# Patient Record
Sex: Female | Born: 1950 | Race: White | Hispanic: No | Smoking: Former smoker
Health system: Southern US, Community
[De-identification: ages and names within clinical notes are randomized; demographics above are authoritative.]

## PROBLEM LIST (undated history)

## (undated) DIAGNOSIS — Z9889 Other specified postprocedural states: Secondary | ICD-10-CM

## (undated) DIAGNOSIS — H2522 Age-related cataract, morgagnian type, left eye: Secondary | ICD-10-CM

## (undated) DIAGNOSIS — R011 Cardiac murmur, unspecified: Secondary | ICD-10-CM

## (undated) DIAGNOSIS — T4145XA Adverse effect of unspecified anesthetic, initial encounter: Secondary | ICD-10-CM

## (undated) DIAGNOSIS — T8859XA Other complications of anesthesia, initial encounter: Secondary | ICD-10-CM

## (undated) DIAGNOSIS — M1990 Unspecified osteoarthritis, unspecified site: Secondary | ICD-10-CM

## (undated) DIAGNOSIS — R112 Nausea with vomiting, unspecified: Secondary | ICD-10-CM

## (undated) HISTORY — PX: CHOLECYSTECTOMY: SHX55

## (undated) HISTORY — PX: APPENDECTOMY: SHX54

## (undated) HISTORY — PX: EYE SURGERY: SHX253

## (undated) HISTORY — PX: BACK SURGERY: SHX140

## (undated) HISTORY — PX: TONSILLECTOMY: SUR1361

## (undated) HISTORY — PX: JOINT REPLACEMENT: SHX530

## (undated) HISTORY — PX: OTHER SURGICAL HISTORY: SHX169

---

## 2018-04-16 ENCOUNTER — Other Ambulatory Visit: Payer: Self-pay | Admitting: Orthopedic Surgery

## 2018-04-16 ENCOUNTER — Ambulatory Visit
Admission: RE | Admit: 2018-04-16 | Discharge: 2018-04-16 | Disposition: A | Discharge status: Home / Self Care | Payer: Medicare Other | Source: Ambulatory Visit | Attending: Orthopedic Surgery | Admitting: Orthopedic Surgery

## 2018-04-16 DIAGNOSIS — M25561 Pain in right knee: Secondary | ICD-10-CM

## 2018-04-16 DIAGNOSIS — M25551 Pain in right hip: Secondary | ICD-10-CM

## 2018-08-17 ENCOUNTER — Other Ambulatory Visit: Payer: Self-pay | Admitting: Orthopedic Surgery

## 2018-09-24 ENCOUNTER — Other Ambulatory Visit (HOSPITAL_COMMUNITY): Payer: Self-pay | Admitting: *Deleted

## 2018-09-24 NOTE — Patient Instructions (Signed)
Tamara Alvarado  09/24/2018   Your procedure is scheduled on:  10-05-2018  Report to [facility] Main  Entrance  Report to admitting at 655 AM    Call this number if you have problems the morning of surgery [phone]   Remember: Do not eat food or drink liquids :After Midnight. BRUSH YOUR TEETH MORNING OF SURGERY AND RINSE YOUR MOUTH OUT, NO CHEWING GUM CANDY OR MINTS.     Take these medicines the morning of surgery with A SIP OF WATER: none                               You may not have any metal on your body including hair pins and              piercings  Do not wear jewelry, make-up, lotions, powders or perfumes, deodorant             Do not wear nail polish.  Do not shave  48 hours prior to surgery.              Men may shave face and neck.   Do not bring valuables to the hospital. [facility] IS NOT             RESPONSIBLE   FOR VALUABLES.  Contacts, dentures or bridgework may not be worn into surgery.  Leave suitcase in the car. After surgery it may be brought to your room.                  Please read over the following fact sheets you were given: _____________________________________________________________________             [facility] - Preparing for Surgery Before surgery, you can play an important role.  Because skin is not sterile, your skin needs to be as free of germs as possible.  You can reduce the number of germs on your skin by washing with CHG (chlorahexidine gluconate) soap before surgery.  CHG is an antiseptic cleaner which kills germs and bonds with the skin to continue killing germs even after washing. Please DO NOT use if you have an allergy to CHG or antibacterial soaps.  If your skin becomes reddened/irritated stop using the CHG and inform your nurse when you arrive at Short Stay. Do not shave (including legs and underarms) for at least 48 hours prior to the first CHG shower.  You may shave your face/neck. Please follow these  instructions carefully:  1.  Shower with CHG Soap the night before surgery and the  morning of Surgery.  2.  If you choose to wash your hair, wash your hair first as usual with your  normal  shampoo.  3.  After you shampoo, rinse your hair and body thoroughly to remove the  shampoo.                           4.  Use CHG as you would any other liquid soap.  You can apply chg directly  to the skin and wash                       Gently with a scrungie or clean washcloth.  5.  Apply the CHG Soap to your body ONLY FROM THE NECK DOWN.  Do not use on face/ open                           Wound or open sores. Avoid contact with eyes, ears mouth and genitals (private parts).                       Wash face,  Genitals (private parts) with your normal soap.             6.  Wash thoroughly, paying special attention to the area where your surgery  will be performed.  7.  Thoroughly rinse your body with warm water from the neck down.  8.  DO NOT shower/wash with your normal soap after using and rinsing off  the CHG Soap.                9.  Pat yourself dry with a clean towel.            10.  Wear clean pajamas.            11.  Place clean sheets on your bed the night of your first shower and do not  sleep with pets. Day of Surgery : Do not apply any lotions/deodorants the morning of surgery.  Please wear clean clothes to the hospital/surgery center.  FAILURE TO FOLLOW THESE INSTRUCTIONS MAY RESULT IN THE CANCELLATION OF YOUR SURGERY PATIENT SIGNATURE_________________________________  NURSE SIGNATURE__________________________________  ________________________________________________________________________   Adam Phenix  An incentive spirometer is a tool that can help keep your lungs clear and active. This tool measures how well you are filling your lungs with each breath. Taking long deep breaths may help reverse or decrease the chance of developing breathing (pulmonary) problems (especially  infection) following:  A long period of time when you are unable to move or be active. BEFORE THE PROCEDURE   If the spirometer includes an indicator to show your best effort, your nurse or respiratory therapist will set it to a desired goal.  If possible, sit up straight or lean slightly forward. Try not to slouch.  Hold the incentive spirometer in an upright position. INSTRUCTIONS FOR USE  1. Sit on the edge of your bed if possible, or sit up as far as you can in bed or on a chair. 2. Hold the incentive spirometer in an upright position. 3. Breathe out normally. 4. Place the mouthpiece in your mouth and seal your lips tightly around it. 5. Breathe in slowly and as deeply as possible, raising the piston or the ball toward the top of the column. 6. Hold your breath for 3-5 seconds or for as long as possible. Allow the piston or ball to fall to the bottom of the column. 7. Remove the mouthpiece from your mouth and breathe out normally. 8. Rest for a few seconds and repeat Steps 1 through 7 at least 10 times every 1-2 hours when you are awake. Take your time and take a few normal breaths between deep breaths. 9. The spirometer may include an indicator to show your best effort. Use the indicator as a goal to work toward during each repetition. 10. After each set of 10 deep breaths, practice coughing to be sure your lungs are clear. If you have an incision (the cut made at the time of surgery), support your incision when coughing by placing a pillow or rolled up towels firmly against it. Once you are able to get out of  bed, walk around indoors and cough well. You may stop using the incentive spirometer when instructed by your caregiver.  RISKS AND COMPLICATIONS  Take your time so you do not get dizzy or light-headed.  If you are in pain, you may need to take or ask for pain medication before doing incentive spirometry. It is harder to take a deep breath if you are having pain. AFTER  USE  Rest and breathe slowly and easily.  It can be helpful to keep track of a log of your progress. Your caregiver can provide you with a simple table to help with this. If you are using the spirometer at home, follow these instructions: [place] IF:   You are having difficultly using the spirometer.  You have trouble using the spirometer as often as instructed.  Your pain medication is not giving enough relief while using the spirometer.  You develop fever of 100.5 F (38.1 C) or higher. SEEK IMMEDIATE MEDICAL CARE IF:   You cough up bloody sputum that had not been present before.  You develop fever of 102 F (38.9 C) or greater.  You develop worsening pain at or near the incision site. MAKE SURE YOU:   Understand these instructions.  Will watch your condition.  Will get help right away if you are not doing well or get worse. Document Released: 01/13/2007 Document Revised: 11/25/2011 Document Reviewed: 03/16/2007 Exit[employer] Patient Information 2014 Exit[employer], [place].   ________________________________________________________________________  WHAT IS A BLOOD TRANSFUSION? Blood Transfusion Information  A transfusion is the replacement of blood or some of its parts. Blood is made up of multiple cells which provide different functions.  Red blood cells carry oxygen and are used for blood loss replacement.  White blood cells fight against infection.  Platelets control bleeding.  Plasma helps clot blood.  Other blood products are available for specialized needs, such as hemophilia or other clotting disorders. BEFORE THE TRANSFUSION  Who gives blood for transfusions?   Healthy volunteers who are fully evaluated to make sure their blood is safe. This is blood bank blood. Transfusion therapy is the safest it has ever been in the practice of medicine. Before blood is taken from a donor, a complete history is taken to make sure that person has no history of diseases  nor engages in risky social behavior (examples are intravenous drug use or sexual activity with multiple partners). The donor's travel history is screened to minimize risk of transmitting infections, such as malaria. The donated blood is tested for signs of infectious diseases, such as HIV and hepatitis. The blood is then tested to be sure it is compatible with you in order to minimize the chance of a transfusion reaction. If you or a relative donates blood, this is often done in anticipation of surgery and is not appropriate for emergency situations. It takes many days to process the donated blood. RISKS AND COMPLICATIONS Although transfusion therapy is very safe and saves many lives, the main dangers of transfusion include:   Getting an infectious disease.  Developing a transfusion reaction. This is an allergic reaction to something in the blood you were given. Every precaution is taken to prevent this. The decision to have a blood transfusion has been considered carefully by your caregiver before blood is given. Blood is not given unless the benefits outweigh the risks. AFTER THE TRANSFUSION  Right after receiving a blood transfusion, you will usually feel much better and more energetic. This is especially true if your red blood  cells have gotten low (anemic). The transfusion raises the level of the red blood cells which carry oxygen, and this usually causes an energy increase.  The nurse administering the transfusion will monitor you carefully for complications. HOME CARE INSTRUCTIONS  No special instructions are needed after a transfusion. You may find your energy is better. Speak with your caregiver about any limitations on activity for underlying diseases you may have. SEEK MEDICAL CARE IF:   Your condition is not improving after your transfusion.  You develop redness or irritation at the intravenous (IV) site. SEEK IMMEDIATE MEDICAL CARE IF:  Any of the following symptoms occur over the  next 12 hours:  Shaking chills.  You have a temperature by mouth above 102 F (38.9 C), not controlled by medicine.  Chest, back, or muscle pain.  People around you feel you are not acting correctly or are confused.  Shortness of breath or difficulty breathing.  Dizziness and fainting.  You get a rash or develop hives.  You have a decrease in urine output.  Your urine turns a dark color or changes to pink, red, or Jacober. Any of the following symptoms occur over the next 10 days:  You have a temperature by mouth above 102 F (38.9 C), not controlled by medicine.  Shortness of breath.  Weakness after normal activity.  The white part of the eye turns yellow (jaundice).  You have a decrease in the amount of urine or are urinating less often.  Your urine turns a dark color or changes to pink, red, or Stober. Document Released: 08/30/2000 Document Revised: 11/25/2011 Document Reviewed: 04/18/2008 [facility] Patient Information 2014 [place], [place].  _______________________________________________________________________

## 2018-09-30 ENCOUNTER — Other Ambulatory Visit: Payer: Self-pay

## 2018-09-30 ENCOUNTER — Encounter (HOSPITAL_COMMUNITY)
Admission: RE | Admit: 2018-09-30 | Discharge: 2018-09-30 | Disposition: A | Discharge status: Home / Self Care | Payer: Medicare Other | Source: Ambulatory Visit | Attending: Orthopedic Surgery | Admitting: Orthopedic Surgery

## 2018-09-30 ENCOUNTER — Encounter (HOSPITAL_COMMUNITY): Payer: Self-pay | Admitting: *Deleted

## 2018-09-30 DIAGNOSIS — M1711 Unilateral primary osteoarthritis, right knee: Secondary | ICD-10-CM | POA: Diagnosis not present

## 2018-09-30 DIAGNOSIS — Z01812 Encounter for preprocedural laboratory examination: Secondary | ICD-10-CM | POA: Insufficient documentation

## 2018-09-30 HISTORY — DX: Adverse effect of unspecified anesthetic, initial encounter: T41.45XA

## 2018-09-30 HISTORY — DX: Cardiac murmur, unspecified: R01.1

## 2018-09-30 HISTORY — DX: Unspecified osteoarthritis, unspecified site: M19.90

## 2018-09-30 HISTORY — DX: Other complications of anesthesia, initial encounter: T88.59XA

## 2018-09-30 HISTORY — DX: Other specified postprocedural states: Z98.890

## 2018-09-30 HISTORY — DX: Age-related cataract, morgagnian type, left eye: H25.22

## 2018-09-30 HISTORY — DX: Other specified postprocedural states: R11.2

## 2018-09-30 LAB — COMPREHENSIVE METABOLIC PANEL
ALT: 13 U/L (ref 0–44)
AST: 23 U/L (ref 15–41)
Albumin: 4.2 g/dL (ref 3.5–5.0)
Alkaline Phosphatase: 53 U/L (ref 38–126)
Anion gap: 9 (ref 5–15)
BUN: 21 mg/dL (ref 8–23)
CO2: 25 mmol/L (ref 22–32)
CREATININE: 0.88 mg/dL (ref 0.44–1.00)
Calcium: 9.4 mg/dL (ref 8.9–10.3)
Chloride: 105 mmol/L (ref 98–111)
GFR calc non Af Amer: >60 mL/min (ref >60)
Glucose, Bld: 99 mg/dL (ref 70–99)
Potassium: 4.5 mmol/L (ref 3.5–5.1)
Sodium: 139 mmol/L (ref 135–145)
Total Bilirubin: 0.6 mg/dL (ref 0.3–1.2)
Total Protein: 7.5 g/dL (ref 6.5–8.1)

## 2018-09-30 LAB — CBC WITH DIFFERENTIAL/PLATELET
Abs Immature Granulocytes: 0.02 10*3/uL (ref 0.00–0.07)
Basophils Absolute: 0.1 10*3/uL (ref 0.0–0.1)
Basophils Relative: 2 %
Eosinophils Absolute: 0.1 10*3/uL (ref 0.0–0.5)
Eosinophils Relative: 1 %
HCT: 42.5 % (ref 36.0–46.0)
Hemoglobin: 13.3 g/dL (ref 12.0–15.0)
Immature Granulocytes: 0 %
Lymphocytes Relative: 37 %
Lymphs Abs: 1.9 10*3/uL (ref 0.7–4.0)
MCH: 30.9 pg (ref 26.0–34.0)
MCHC: 31.3 g/dL (ref 30.0–36.0)
MCV: 98.6 fL (ref 80.0–100.0)
MONO ABS: 0.4 10*3/uL (ref 0.1–1.0)
Monocytes Relative: 8 %
NEUTROS ABS: 2.7 10*3/uL (ref 1.7–7.7)
Neutrophils Relative %: 52 %
Platelets: 273 10*3/uL (ref 150–400)
RBC: 4.31 MIL/uL (ref 3.87–5.11)
RDW: 13.1 % (ref 11.5–15.5)
WBC: 5.1 10*3/uL (ref 4.0–10.5)
nRBC: 0 % (ref 0.0–0.2)

## 2018-09-30 LAB — SURGICAL PCR SCREEN
MRSA, PCR: NEGATIVE
STAPHYLOCOCCUS AUREUS: POSITIVE — AB

## 2018-10-04 MED ORDER — BUPIVACAINE LIPOSOME 1.3 % IJ SUSP
20.0000 mL | Freq: Once | INTRAMUSCULAR | Status: DC
  Filled 2018-10-04: qty 20

## 2018-10-05 ENCOUNTER — Encounter (HOSPITAL_COMMUNITY)
Admission: RE | Disposition: A | Discharge status: Home / Self Care | Payer: Self-pay | Source: Home / Self Care | Attending: Orthopedic Surgery

## 2018-10-05 ENCOUNTER — Encounter (HOSPITAL_COMMUNITY): Payer: Self-pay

## 2018-10-05 ENCOUNTER — Observation Stay (HOSPITAL_COMMUNITY)
Admission: RE | Admit: 2018-10-05 | Discharge: 2018-10-06 | Disposition: A | Discharge status: Home / Self Care | Payer: Medicare Other | Attending: Orthopedic Surgery | Admitting: Orthopedic Surgery

## 2018-10-05 ENCOUNTER — Ambulatory Visit (HOSPITAL_COMMUNITY): Payer: Medicare Other | Admitting: Physician Assistant

## 2018-10-05 ENCOUNTER — Other Ambulatory Visit: Payer: Self-pay

## 2018-10-05 ENCOUNTER — Ambulatory Visit (HOSPITAL_COMMUNITY): Payer: Medicare Other | Admitting: Anesthesiology

## 2018-10-05 DIAGNOSIS — Z881 Allergy status to other antibiotic agents status: Secondary | ICD-10-CM | POA: Diagnosis not present

## 2018-10-05 DIAGNOSIS — Z87891 Personal history of nicotine dependence: Secondary | ICD-10-CM | POA: Diagnosis not present

## 2018-10-05 DIAGNOSIS — Z91040 Latex allergy status: Secondary | ICD-10-CM | POA: Diagnosis not present

## 2018-10-05 DIAGNOSIS — Z882 Allergy status to sulfonamides status: Secondary | ICD-10-CM | POA: Insufficient documentation

## 2018-10-05 DIAGNOSIS — M1711 Unilateral primary osteoarthritis, right knee: Principal | ICD-10-CM | POA: Insufficient documentation

## 2018-10-05 DIAGNOSIS — Z96659 Presence of unspecified artificial knee joint: Secondary | ICD-10-CM

## 2018-10-05 DIAGNOSIS — Z880 Allergy status to penicillin: Secondary | ICD-10-CM | POA: Diagnosis not present

## 2018-10-05 DIAGNOSIS — Z96643 Presence of artificial hip joint, bilateral: Secondary | ICD-10-CM | POA: Diagnosis not present

## 2018-10-05 HISTORY — PX: TOTAL KNEE ARTHROPLASTY: SHX125

## 2018-10-05 SURGERY — ARTHROPLASTY, KNEE, TOTAL
Anesthesia: Regional | Site: Knee | Laterality: Right

## 2018-10-05 MED ORDER — GABAPENTIN 300 MG PO CAPS
300.0000 mg | ORAL_CAPSULE | Freq: Three times a day (TID) | ORAL | Status: DC
  Administered 2018-10-05 – 2018-10-06 (×3): 300 mg via ORAL
  Filled 2018-10-05 (×3): qty 1

## 2018-10-05 MED ORDER — CHLORHEXIDINE GLUCONATE 4 % EX LIQD: 60.0000 mL | Freq: Once | CUTANEOUS | Status: DC

## 2018-10-05 MED ORDER — METHOCARBAMOL 500 MG PO TABS
500.0000 mg | ORAL_TABLET | Freq: Four times a day (QID) | ORAL | Status: DC | PRN
  Administered 2018-10-06: 500 mg via ORAL
  Filled 2018-10-05: qty 1

## 2018-10-05 MED ORDER — SENNOSIDES-DOCUSATE SODIUM 8.6-50 MG PO TABS: 1.0000 | ORAL_TABLET | Freq: Every evening | ORAL | Status: DC | PRN

## 2018-10-05 MED ORDER — DEXAMETHASONE SODIUM PHOSPHATE 10 MG/ML IJ SOLN: 8.0000 mg | Freq: Once | INTRAMUSCULAR | Status: DC

## 2018-10-05 MED ORDER — ACETAMINOPHEN 500 MG PO TABS
1000.0000 mg | ORAL_TABLET | Freq: Four times a day (QID) | ORAL | Status: AC
  Administered 2018-10-05 (×3): 1000 mg via ORAL
  Filled 2018-10-05 (×4): qty 2

## 2018-10-05 MED ORDER — METOCLOPRAMIDE HCL 5 MG/ML IJ SOLN: 5.0000 mg | Freq: Three times a day (TID) | INTRAMUSCULAR | Status: DC | PRN

## 2018-10-05 MED ORDER — DIPHENHYDRAMINE HCL 12.5 MG/5ML PO ELIX: 12.5000 mg | ORAL_SOLUTION | ORAL | Status: DC | PRN

## 2018-10-05 MED ORDER — CEFAZOLIN SODIUM-DEXTROSE 2-4 GM/100ML-% IV SOLN
2.0000 g | Freq: Four times a day (QID) | INTRAVENOUS | Status: AC
  Administered 2018-10-05 (×2): 2 g via INTRAVENOUS
  Filled 2018-10-05 (×2): qty 100

## 2018-10-05 MED ORDER — BUPIVACAINE-EPINEPHRINE 0.5% -1:200000 IJ SOLN
INTRAMUSCULAR | Status: DC | PRN
  Administered 2018-10-05: 30 mL

## 2018-10-05 MED ORDER — SODIUM CHLORIDE 0.9 % IR SOLN
Status: DC | PRN
  Administered 2018-10-05 (×2): 1000 mL

## 2018-10-05 MED ORDER — DEXAMETHASONE SODIUM PHOSPHATE 10 MG/ML IJ SOLN
10.0000 mg | Freq: Once | INTRAMUSCULAR | Status: AC
  Administered 2018-10-06: 10 mg via INTRAVENOUS
  Filled 2018-10-05: qty 1

## 2018-10-05 MED ORDER — BUPIVACAINE-EPINEPHRINE (PF) 0.25% -1:200000 IJ SOLN
INTRAMUSCULAR | Status: AC
  Filled 2018-10-05: qty 30

## 2018-10-05 MED ORDER — BISACODYL 5 MG PO TBEC: 5.0000 mg | DELAYED_RELEASE_TABLET | Freq: Every day | ORAL | Status: DC | PRN

## 2018-10-05 MED ORDER — SODIUM CHLORIDE 0.9 % IV SOLN
INTRAVENOUS | Status: DC | PRN
  Administered 2018-10-05: 25 ug/min via INTRAVENOUS

## 2018-10-05 MED ORDER — BUPIVACAINE LIPOSOME 1.3 % IJ SUSP
INTRAMUSCULAR | Status: DC | PRN
  Administered 2018-10-05: 20 mL

## 2018-10-05 MED ORDER — PROPOFOL 500 MG/50ML IV EMUL
INTRAVENOUS | Status: DC | PRN
  Administered 2018-10-05: 75 ug/kg/min via INTRAVENOUS

## 2018-10-05 MED ORDER — OXYCODONE HCL 5 MG PO TABS
5.0000 mg | ORAL_TABLET | ORAL | Status: DC | PRN
  Filled 2018-10-05: qty 2

## 2018-10-05 MED ORDER — SODIUM CHLORIDE 0.9 % IV SOLN
INTRAVENOUS | Status: DC
  Administered 2018-10-05: 15:00:00 via INTRAVENOUS

## 2018-10-05 MED ORDER — HYDROMORPHONE HCL 1 MG/ML IJ SOLN: 0.5000 mg | INTRAMUSCULAR | Status: DC | PRN

## 2018-10-05 MED ORDER — DEXAMETHASONE SODIUM PHOSPHATE 10 MG/ML IJ SOLN
INTRAMUSCULAR | Status: DC | PRN
  Administered 2018-10-05: 8 mg via INTRAVENOUS

## 2018-10-05 MED ORDER — FENTANYL CITRATE (PF) 250 MCG/5ML IJ SOLN
INTRAMUSCULAR | Status: DC | PRN
  Administered 2018-10-05 (×2): 25 ug via INTRAVENOUS

## 2018-10-05 MED ORDER — LACTATED RINGERS IV SOLN
INTRAVENOUS | Status: DC
  Administered 2018-10-05: 1000 mL via INTRAVENOUS
  Administered 2018-10-05: 10:00:00 via INTRAVENOUS

## 2018-10-05 MED ORDER — PHENOL 1.4 % MT LIQD: 1.0000 | OROMUCOSAL | Status: DC | PRN

## 2018-10-05 MED ORDER — FENTANYL CITRATE (PF) 100 MCG/2ML IJ SOLN
50.0000 ug | INTRAMUSCULAR | Status: DC
  Filled 2018-10-05: qty 2

## 2018-10-05 MED ORDER — METOCLOPRAMIDE HCL 5 MG PO TABS: 5.0000 mg | ORAL_TABLET | Freq: Three times a day (TID) | ORAL | Status: DC | PRN

## 2018-10-05 MED ORDER — FENTANYL CITRATE (PF) 100 MCG/2ML IJ SOLN: 25.0000 ug | INTRAMUSCULAR | Status: DC | PRN

## 2018-10-05 MED ORDER — MENTHOL 3 MG MT LOZG: 1.0000 | LOZENGE | OROMUCOSAL | Status: DC | PRN

## 2018-10-05 MED ORDER — BUPIVACAINE IN DEXTROSE 0.75-8.25 % IT SOLN
INTRATHECAL | Status: DC | PRN
  Administered 2018-10-05: 1.6 mL via INTRATHECAL

## 2018-10-05 MED ORDER — CLONIDINE HCL (ANALGESIA) 100 MCG/ML EP SOLN
EPIDURAL | Status: DC | PRN
  Administered 2018-10-05: 50 ug

## 2018-10-05 MED ORDER — TRANEXAMIC ACID-NACL 1000-0.7 MG/100ML-% IV SOLN
1000.0000 mg | INTRAVENOUS | Status: AC
  Administered 2018-10-05: 1000 mg via INTRAVENOUS
  Filled 2018-10-05: qty 100

## 2018-10-05 MED ORDER — DOCUSATE SODIUM 100 MG PO CAPS
100.0000 mg | ORAL_CAPSULE | Freq: Two times a day (BID) | ORAL | Status: DC
  Administered 2018-10-05 – 2018-10-06 (×3): 100 mg via ORAL
  Filled 2018-10-05 (×3): qty 1

## 2018-10-05 MED ORDER — MIDAZOLAM HCL 2 MG/2ML IJ SOLN
INTRAMUSCULAR | Status: DC | PRN
  Administered 2018-10-05: 1 mg via INTRAVENOUS

## 2018-10-05 MED ORDER — TRANEXAMIC ACID-NACL 1000-0.7 MG/100ML-% IV SOLN
1000.0000 mg | Freq: Once | INTRAVENOUS | Status: AC
  Administered 2018-10-05: 1000 mg via INTRAVENOUS
  Filled 2018-10-05: qty 100

## 2018-10-05 MED ORDER — MIDAZOLAM HCL 2 MG/2ML IJ SOLN
INTRAMUSCULAR | Status: AC
  Filled 2018-10-05: qty 2

## 2018-10-05 MED ORDER — ACETAMINOPHEN 500 MG PO TABS
1000.0000 mg | ORAL_TABLET | Freq: Once | ORAL | Status: AC
  Administered 2018-10-05: 1000 mg via ORAL
  Filled 2018-10-05: qty 2

## 2018-10-05 MED ORDER — SODIUM CHLORIDE 0.9% FLUSH
INTRAVENOUS | Status: DC | PRN
  Administered 2018-10-05: 20 mL

## 2018-10-05 MED ORDER — PROPOFOL 10 MG/ML IV BOLUS
INTRAVENOUS | Status: DC | PRN
  Administered 2018-10-05: 20 mg via INTRAVENOUS

## 2018-10-05 MED ORDER — METHOCARBAMOL 500 MG IVPB - SIMPLE MED
INTRAVENOUS | Status: AC
  Filled 2018-10-05: qty 50

## 2018-10-05 MED ORDER — LIDOCAINE 2% (20 MG/ML) 5 ML SYRINGE
INTRAMUSCULAR | Status: DC | PRN
  Administered 2018-10-05: 40 mg via INTRAVENOUS

## 2018-10-05 MED ORDER — ONDANSETRON HCL 4 MG PO TABS
4.0000 mg | ORAL_TABLET | Freq: Four times a day (QID) | ORAL | Status: DC | PRN
  Administered 2018-10-05: 4 mg via ORAL
  Filled 2018-10-05: qty 1

## 2018-10-05 MED ORDER — MIDAZOLAM HCL 2 MG/2ML IJ SOLN
0.5000 mg | INTRAMUSCULAR | Status: DC
  Administered 2018-10-05: 2 mg via INTRAVENOUS
  Filled 2018-10-05: qty 2

## 2018-10-05 MED ORDER — TRAMADOL HCL 50 MG PO TABS
50.0000 mg | ORAL_TABLET | Freq: Four times a day (QID) | ORAL | Status: DC
  Administered 2018-10-05 – 2018-10-06 (×4): 50 mg via ORAL
  Filled 2018-10-05 (×5): qty 1

## 2018-10-05 MED ORDER — FENTANYL CITRATE (PF) 250 MCG/5ML IJ SOLN
INTRAMUSCULAR | Status: AC
  Filled 2018-10-05: qty 5

## 2018-10-05 MED ORDER — ZOLPIDEM TARTRATE 5 MG PO TABS: 5.0000 mg | ORAL_TABLET | Freq: Every evening | ORAL | Status: DC | PRN

## 2018-10-05 MED ORDER — FERROUS SULFATE 325 (65 FE) MG PO TABS
325.0000 mg | ORAL_TABLET | Freq: Three times a day (TID) | ORAL | Status: DC
  Administered 2018-10-05 – 2018-10-06 (×3): 325 mg via ORAL
  Filled 2018-10-05 (×3): qty 1

## 2018-10-05 MED ORDER — SODIUM CHLORIDE (PF) 0.9 % IJ SOLN
INTRAMUSCULAR | Status: AC
  Filled 2018-10-05: qty 20

## 2018-10-05 MED ORDER — CEFAZOLIN SODIUM-DEXTROSE 2-4 GM/100ML-% IV SOLN
2.0000 g | INTRAVENOUS | Status: AC
  Administered 2018-10-05: 2 g via INTRAVENOUS
  Filled 2018-10-05: qty 100

## 2018-10-05 MED ORDER — PROPOFOL 10 MG/ML IV BOLUS
INTRAVENOUS | Status: AC
  Filled 2018-10-05: qty 60

## 2018-10-05 MED ORDER — METHOCARBAMOL 500 MG IVPB - SIMPLE MED
500.0000 mg | Freq: Four times a day (QID) | INTRAVENOUS | Status: DC | PRN
  Administered 2018-10-05: 500 mg via INTRAVENOUS
  Filled 2018-10-05: qty 50

## 2018-10-05 MED ORDER — ONDANSETRON HCL 4 MG/2ML IJ SOLN: 4.0000 mg | Freq: Four times a day (QID) | INTRAMUSCULAR | Status: DC | PRN

## 2018-10-05 MED ORDER — ALUM & MAG HYDROXIDE-SIMETH 200-200-20 MG/5ML PO SUSP: 30.0000 mL | ORAL | Status: DC | PRN

## 2018-10-05 MED ORDER — BUPIVACAINE HCL (PF) 0.5 % IJ SOLN
INTRAMUSCULAR | Status: DC | PRN
  Administered 2018-10-05: 20 mL via PERINEURAL

## 2018-10-05 MED ORDER — FLEET ENEMA 7-19 GM/118ML RE ENEM: 1.0000 | ENEMA | Freq: Once | RECTAL | Status: DC | PRN

## 2018-10-05 MED ORDER — STERILE WATER FOR INJECTION IJ SOLN
INTRAMUSCULAR | Status: DC | PRN
  Administered 2018-10-05: 2000 mL

## 2018-10-05 MED ORDER — GABAPENTIN 300 MG PO CAPS
300.0000 mg | ORAL_CAPSULE | Freq: Once | ORAL | Status: AC
  Administered 2018-10-05: 300 mg via ORAL
  Filled 2018-10-05: qty 1

## 2018-10-05 MED ORDER — ASPIRIN EC 325 MG PO TBEC
325.0000 mg | DELAYED_RELEASE_TABLET | Freq: Two times a day (BID) | ORAL | Status: DC
  Administered 2018-10-06: 325 mg via ORAL
  Filled 2018-10-05: qty 1

## 2018-10-05 MED ORDER — PANTOPRAZOLE SODIUM 40 MG PO TBEC
40.0000 mg | DELAYED_RELEASE_TABLET | Freq: Every day | ORAL | Status: DC
  Administered 2018-10-05 – 2018-10-06 (×2): 40 mg via ORAL
  Filled 2018-10-05 (×2): qty 1

## 2018-10-05 MED ORDER — ONDANSETRON HCL 4 MG/2ML IJ SOLN
INTRAMUSCULAR | Status: DC | PRN
  Administered 2018-10-05: 4 mg via INTRAVENOUS

## 2018-10-05 SURGICAL SUPPLY — 56 items
ARTISURF 10M PLY R 6-9CD KNEE (Knees) ×2 IMPLANT
BAG ZIPLOCK 12X15 (MISCELLANEOUS) ×3 IMPLANT
BANDAGE ACE 6X5 VEL STRL LF (GAUZE/BANDAGES/DRESSINGS) ×3 IMPLANT
BANDAGE ELASTIC 6 VELCRO ST LF (GAUZE/BANDAGES/DRESSINGS) ×2 IMPLANT
BLADE SAGITTAL 13X1.27X60 (BLADE) ×2 IMPLANT
BLADE SAGITTAL 13X1.27X60MM (BLADE) ×1
BLADE SAW SGTL 83.5X18.5 (BLADE) ×3 IMPLANT
BLADE SURG 15 STRL LF DISP TIS (BLADE) ×1 IMPLANT
BLADE SURG 15 STRL SS (BLADE) ×2
BLADE SURG SZ10 CARB STEEL (BLADE) ×6 IMPLANT
BOWL SMART MIX CTS (DISPOSABLE) ×3 IMPLANT
CEMENT BONE SIMPLEX SPEEDSET (Cement) ×6 IMPLANT
CLOSURE WOUND 1/2 X4 (GAUZE/BANDAGES/DRESSINGS) ×1
COMP FEM PERSONA SZ7 RT (Joint) ×3 IMPLANT
COMPONENT FEM PERSONA SZ7 RT (Joint) IMPLANT
COVER SURGICAL LIGHT HANDLE (MISCELLANEOUS) ×3 IMPLANT
COVER WAND RF STERILE (DRAPES) ×2 IMPLANT
CUFF TOURN SGL QUICK 34 (TOURNIQUET CUFF) ×2
CUFF TRNQT CYL 34X4X40X1 (TOURNIQUET CUFF) ×1 IMPLANT
DECANTER SPIKE VIAL GLASS SM (MISCELLANEOUS) ×6 IMPLANT
DRAPE INCISE IOBAN 66X45 STRL (DRAPES) ×6 IMPLANT
DRAPE U-SHAPE 47X51 STRL (DRAPES) ×3 IMPLANT
DRSG AQUACEL AG ADV 3.5X10 (GAUZE/BANDAGES/DRESSINGS) ×3 IMPLANT
DURAPREP 26ML APPLICATOR (WOUND CARE) ×6 IMPLANT
ELECT REM PT RETURN 15FT ADLT (MISCELLANEOUS) ×3 IMPLANT
GLOVE BIOGEL M STRL SZ7.5 (GLOVE) ×3 IMPLANT
GLOVE BIOGEL PI IND STRL 7.5 (GLOVE) ×1 IMPLANT
GLOVE BIOGEL PI IND STRL 8.5 (GLOVE) ×2 IMPLANT
GLOVE BIOGEL PI INDICATOR 7.5 (GLOVE) ×2
GLOVE BIOGEL PI INDICATOR 8.5 (GLOVE) ×4
GLOVE SURG ORTHO 8.0 STRL STRW (GLOVE) ×9 IMPLANT
GOWN STRL REUS W/ TWL XL LVL3 (GOWN DISPOSABLE) ×2 IMPLANT
GOWN STRL REUS W/TWL XL LVL3 (GOWN DISPOSABLE) ×4
HANDPIECE INTERPULSE COAX TIP (DISPOSABLE) ×2
HOLDER FOLEY CATH W/STRAP (MISCELLANEOUS) ×3 IMPLANT
HOOD PEEL AWAY FLYTE STAYCOOL (MISCELLANEOUS) ×9 IMPLANT
MANIFOLD NEPTUNE II (INSTRUMENTS) ×3 IMPLANT
NS IRRIG 1000ML POUR BTL (IV SOLUTION) ×3 IMPLANT
PACK TOTAL KNEE CUSTOM (KITS) ×3 IMPLANT
PROTECTOR NERVE ULNAR (MISCELLANEOUS) ×3 IMPLANT
SET HNDPC FAN SPRY TIP SCT (DISPOSABLE) ×1 IMPLANT
STEM POLY PAT PLY 32M KNEE (Knees) ×2 IMPLANT
STEM TIBIA 5 DEG SZ D R KNEE (Knees) IMPLANT
STRIP CLOSURE SKIN 1/2X4 (GAUZE/BANDAGES/DRESSINGS) ×2 IMPLANT
SUT BONE WAX W31G (SUTURE) ×3 IMPLANT
SUT MNCRL AB 3-0 PS2 18 (SUTURE) ×3 IMPLANT
SUT STRATAFIX 0 PDS 27 VIOLET (SUTURE) ×3
SUT STRATAFIX PDS+ 0 24IN (SUTURE) ×3 IMPLANT
SUT VIC AB 1 CT1 36 (SUTURE) ×3 IMPLANT
SUTURE STRATFX 0 PDS 27 VIOLET (SUTURE) ×1 IMPLANT
SYR CONTROL 10ML LL (SYRINGE) ×6 IMPLANT
TIBIA STEM 5 DEG SZ D R KNEE (Knees) ×3 IMPLANT
TRAY FOLEY MTR SLVR 16FR STAT (SET/KITS/TRAYS/PACK) ×3 IMPLANT
WATER STERILE IRR 1000ML POUR (IV SOLUTION) ×6 IMPLANT
WRAP KNEE MAXI GEL POST OP (GAUZE/BANDAGES/DRESSINGS) ×3 IMPLANT
YANKAUER SUCT BULB TIP 10FT TU (MISCELLANEOUS) ×3 IMPLANT

## 2018-10-05 NOTE — H&P (Signed)
Tamara DeistLinda Alvarado MRN:  [id number] DOB/SEX:  06/17/1951/female  CHIEF COMPLAINT:  Painful right Knee  HISTORY: Patient is a 68 y.o. female presented with a history of pain in the right knee. Onset of symptoms was gradual starting a few years ago with gradually worsening course since that time. Patient has been treated conservatively with over-the-counter NSAIDs and activity modification. Patient currently rates pain in the knee at 10 out of 10 with activity. There is pain at night.  PAST MEDICAL HISTORY: There are no active problems to display for this patient.  Past Medical History:  Diagnosis Date  . 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 Arthritis    oa  . Complication of anesthesia    " felt like could not breathe, oxygen levels ok after cervical neck surgery few yrs ago  . Heart murmur    mild no cardiologist per pt  . PONV (postoperative nausea and vomiting)    Past Surgical History:  Procedure Laterality Date  . APPENDECTOMY    . BACK SURGERY  few yrs ago   cervical neck surgeyr  . botox injection to face     dec 2019 and once before  . breast implants placed and removed    . CESAREAN SECTION     x2  . CHOLECYSTECTOMY    . EYE SURGERY     right eye cataract surgery  . JOINT REPLACEMENT     bilateral hips replaced  . sciatic nerve surgery    . TONSILLECTOMY  age 68  . tummy tuck       MEDICATIONS:   No medications prior to admission.    ALLERGIES:   Allergies  Allergen Reactions  . Latex Other (See Comments)    Skin peels off  . Levofloxacin Other (See Comments)    Joint pain  . Penicillins Hives, Swelling and Rash    DID THE REACTION INVOLVE: Swelling of the face/tongue/throat, SOB, or low BP? No Sudden or severe rash/hives, skin peeling, or the inside of the mouth or nose? No Did it require medical treatment? No When did it last happen?20 years ago If all above answers are "NO", may proceed with cephalosporin use.   . Sulfa Antibiotics  Hives, Swelling and Rash    REVIEW OF SYSTEMS:  A comprehensive review of systems was negative except for: Musculoskeletal: positive for arthralgias and bone pain   FAMILY HISTORY:  No family history on file.  SOCIAL HISTORY:   Social History   Tobacco Use  . Smoking status: Former Smoker    Packs/day: 2.00    Years: 37.00    Pack years: 74.00    Types: Cigarettes  . Smokeless tobacco: Never Used  . Tobacco comment: quit 12-13- yrs ago  Substance Use Topics  . Alcohol use: Yes    Comment: occ     EXAMINATION:  Vital signs in last 24 hours:    There were no vitals taken for this visit.  General Appearance:    Alert, cooperative, no distress, appears stated age  Head:    Normocephalic, without obvious abnormality, atraumatic  Eyes:    PERRL, conjunctiva/corneas clear, EOM's intact, fundi    benign, both eyes  Ears:    Normal TM's and external ear canals, both ears  Nose:   Nares normal, septum midline, mucosa normal, no drainage    or sinus tenderness  Throat:   Lips, mucosa, and tongue normal; teeth and gums normal  Neck:   Supple, symmetrical, trachea midline, no  adenopathy;    thyroid:  no enlargement/tenderness/nodules; no carotid   bruit or JVD  Back:     Symmetric, no curvature, ROM normal, no CVA tenderness  Lungs:     Clear to auscultation bilaterally, respirations unlabored  Chest Wall:    No tenderness or deformity   Heart:    Regular rate and rhythm, S1 and S2 normal, no murmur, rub   or gallop  Breast Exam:    No tenderness, masses, or nipple abnormality  Abdomen:     Soft, non-tender, bowel sounds active all four quadrants,    no masses, no organomegaly  Genitalia:    Normal female without lesion, discharge or tenderness  Rectal:    Normal tone, no masses or tenderness;   guaiac negative stool  Extremities:   Extremities normal, atraumatic, no cyanosis or edema  Pulses:   2+ and symmetric all extremities  Skin:   Skin color, texture, turgor normal, no  rashes or lesions  Lymph nodes:   Cervical, supraclavicular, and axillary nodes normal  Neurologic:   CNII-XII intact, normal strength, sensation and reflexes    throughout    Musculoskeletal:  ROM 0-120, Ligaments intact,  Imaging Review Plain radiographs demonstrate severe degenerative joint disease of the right knee. The overall alignment is neutral. The bone quality appears to be good for age and reported activity level.  Assessment/Plan: Primary osteoarthritis, right knee   The patient history, physical examination and imaging studies are consistent with advanced degenerative joint disease of the right knee. The patient has failed conservative treatment.  The clearance notes were reviewed.  After discussion with the patient it was felt that Total Knee Replacement was indicated. The procedure,  risks, and benefits of total knee arthroplasty were presented and reviewed. The risks including but not limited to aseptic loosening, infection, blood clots, vascular injury, stiffness, patella tracking problems complications among others were discussed. The patient acknowledged the explanation, agreed to proceed with the plan.  Preoperative templating of the joint replacement has been completed, documented, and submitted to the Operating Room personnel in order to optimize intra-operative equipment management.    Patient's anticipated LOS is less than 2 midnights, meeting these requirements: - Lives within 1 hour of care - Has a competent adult at home to recover with post-op recover - NO history of  - Chronic pain requiring opiods  - Diabetes  - Coronary Artery Disease  - Heart failure  - Heart attack  - Stroke  - DVT/VTE  - Cardiac arrhythmia  - Respiratory Failure/COPD  - Renal failure  - Anemia  - Advanced Liver disease       [name] 10/05/2018, 6:21 AM

## 2018-10-05 NOTE — Evaluation (Signed)
Physical Therapy Evaluation Patient Details Name: Tamara DeistLinda Alvarado MRN: [id number] DOB: December 07, 1950 Today's Date: 10/05/2018   History of Present Illness  Pt s/p R TKR and with hx of bil THR and back surgery  Clinical Impression  Pt s/p R TKR and presents with decreased R LE strength/ROM and post op pain limiting functional mobility.  Pt should progress well to dc home with family assist.    Follow Up Recommendations Follow surgeon's recommendation for DC plan and follow-up therapies    Equipment Recommendations  None recommended by PT    Recommendations for Other Services       Precautions / Restrictions Precautions Precautions: Knee;Fall Restrictions Weight Bearing Restrictions: No Other Position/Activity Restrictions: WBAT      Mobility  Bed Mobility Overal bed mobility: Needs Assistance Bed Mobility: Supine to Sit     Supine to sit: Min assist     General bed mobility comments: cues for sequence and use of L LE to self assist  Transfers Overall transfer level: Needs assistance Equipment used: Rolling walker (2 wheeled) Transfers: Sit to/from Stand Sit to Stand: Min assist         General transfer comment: cues for LE management and use of UEs to self assist  Ambulation/Gait Ambulation/Gait assistance: Min assist;Min guard Gait Distance (Feet): 80 Feet Assistive device: Rolling walker (2 wheeled) Gait Pattern/deviations: Step-to pattern;Step-through pattern;Decreased step length - right;Decreased step length - left;Shuffle     General Gait Details: cues for posture, position from RW and initial sequence  Stairs            Wheelchair Mobility    Modified Rankin (Stroke Patients Only)       Balance Overall balance assessment: Mild deficits observed, not formally tested                                           Pertinent Vitals/Pain Pain Assessment: 0-10 Pain Score: 4  Pain Location: R knee Pain Descriptors / Indicators:  Aching;Sore Pain Intervention(s): Limited activity within patient's tolerance;Monitored during session;Premedicated before session;Ice applied    Home Living Family/patient expects to be discharged to:: Private residence Living Arrangements: Spouse/significant other Available Help at Discharge: Family Type of Home: House Home Access: Stairs to enter Entrance Stairs-Rails: None Entrance Stairs-Number of Steps: 2 Home Layout: One level Home Equipment: Environmental consultantWalker - 2 wheels;Cane - single point      Prior Function Level of Independence: Independent;Independent with assistive device(s)         Comments: used cane as needed     Hand Dominance        Extremity/Trunk Assessment   Upper Extremity Assessment Upper Extremity Assessment: Overall WFL for tasks assessed    Lower Extremity Assessment Lower Extremity Assessment: RLE deficits/detail    Cervical / Trunk Assessment Cervical / Trunk Assessment: Normal  Communication   Communication: No difficulties  Cognition Arousal/Alertness: Awake/alert Behavior During Therapy: WFL for tasks assessed/performed Overall Cognitive Status: Within Functional Limits for tasks assessed                                        General Comments      Exercises Total Joint Exercises Ankle Circles/Pumps: AROM;Both;15 reps;Supine   Assessment/Plan    PT Assessment    PT Problem List  PT Treatment Interventions      PT Goals (Current goals can be found in the Care Plan section)  Acute Rehab PT Goals Patient Stated Goal: Regain IND PT Goal Formulation: With patient Time For Goal Achievement: 10/12/18 Potential to Achieve Goals: Good    Frequency     Barriers to discharge        Co-evaluation               AM-PAC PT "6 Clicks" Mobility  Outcome Measure Help needed turning from your back to your side while in a flat bed without using bedrails?: A Little Help needed moving from lying on your back  to sitting on the side of a flat bed without using bedrails?: A Little Help needed moving to and from a bed to a chair (including a wheelchair)?: A Little Help needed standing up from a chair using your arms (e.g., wheelchair or bedside chair)?: A Little Help needed to walk in hospital room?: A Little Help needed climbing 3-5 steps with a railing? : A Little 6 Click Score: 18    End of Session Equipment Utilized During Treatment: Gait belt Activity Tolerance: Patient tolerated treatment well Patient left: in chair;with call bell/phone within reach;with family/visitor present Nurse Communication: Mobility status      Time: 1510-1537 PT Time Calculation (min) (ACUTE ONLY): 27 min   Charges:   PT Evaluation $PT Eval Low Complexity: 1 Low PT Treatments $Gait Training: 8-22 mins        Tamara KaufmannHunter Jaimy Alvarado PT Acute Rehabilitation Services Pager [phone] Office [phone]   Tamara Alvarado 10/05/2018, 4:13 PM

## 2018-10-05 NOTE — Anesthesia Procedure Notes (Signed)
Anesthesia Regional Block: Adductor canal block   Pre-Anesthetic Checklist: ,, timeout performed, Correct Patient, Correct Site, Correct Laterality, Correct Procedure, Correct Position, site marked, Risks and benefits discussed,  Surgical consent,  Pre-op evaluation,  At surgeon's request and post-op pain management  Laterality: Right  Prep: Maximum Sterile Barrier Precautions used, chloraprep       Needles:  Injection technique: Single-shot  Needle Type: Echogenic Stimulator Needle     Needle Length: 9cm  Needle Gauge: 22     Additional Needles:   Procedures:,,,, ultrasound used (permanent image in chart),,,,  Narrative:  Start time: 10/05/2018 9:02 AM End time: 10/05/2018 9:12 AM Injection made incrementally with aspirations every 5 mL.  Performed by: Personally  Anesthesiologist: Elmer Picker, MD  Additional Notes: Monitors applied. No increased pain on injection. No increased resistance to injection. Injection made in 5cc increments. Good needle visualization. Patient tolerated procedure well.

## 2018-10-05 NOTE — Anesthesia Procedure Notes (Signed)
Procedure Name: MAC Date/Time: 10/05/2018 9:55 AM Performed by: Cynda Familia, CRNA Pre-anesthesia Checklist: Patient identified, Emergency Drugs available, Suction available, Patient being monitored and Timeout performed Patient Re-evaluated:Patient Re-evaluated prior to induction Oxygen Delivery Method: Simple face mask Placement Confirmation: positive ETCO2 and breath sounds checked- equal and bilateral Dental Injury: Teeth and Oropharynx as per pre-operative assessment  Comments: sedation for spinal

## 2018-10-05 NOTE — Anesthesia Procedure Notes (Signed)
Spinal  Patient location during procedure: OR Start time: 10/05/2018 9:56 AM End time: 10/05/2018 10:06 AM Staffing Anesthesiologist: Elmer Picker, MD Performed: anesthesiologist  Preanesthetic Checklist Completed: patient identified, surgical consent, pre-op evaluation, timeout performed, IV checked, risks and benefits discussed and monitors and equipment checked Spinal Block Patient position: sitting Prep: site prepped and draped and DuraPrep Patient monitoring: cardiac monitor, continuous pulse ox and blood pressure Approach: midline Location: L3-4 Injection technique: single-shot Needle Needle type: Pencan  Needle gauge: 24 G Needle length: 9 cm Assessment Sensory level: T6 Additional Notes Functioning IV was confirmed and monitors were applied. Sterile prep and drape, including hand hygiene and sterile gloves were used. The patient was positioned and the spine was prepped. The skin was anesthetized with lidocaine.  Free flow of clear CSF was obtained prior to injecting local anesthetic into the CSF.  The spinal needle aspirated freely following injection.  The needle was carefully withdrawn.  The patient tolerated the procedure well.

## 2018-10-05 NOTE — Progress Notes (Signed)
Assisted Dr. Woodrum with right, ultrasound guided, adductor canal block. Side rails up, monitors on throughout procedure. See vital signs in flow sheet. Tolerated Procedure well.  

## 2018-10-05 NOTE — Anesthesia Postprocedure Evaluation (Signed)
Anesthesia Post Note  Patient: Marcelline Deist  Procedure(s) Performed: TOTAL KNEE ARTHROPLASTY (Right Knee)     Patient location during evaluation: PACU Anesthesia Type: Regional and Spinal Level of consciousness: oriented and awake and alert Pain management: pain level controlled Vital Signs Assessment: post-procedure vital signs reviewed and stable Respiratory status: spontaneous breathing, respiratory function stable and patient connected to nasal cannula oxygen Cardiovascular status: blood pressure returned to baseline and stable Postop Assessment: no headache, no backache and no apparent nausea or vomiting Anesthetic complications: no    Last Vitals:  Vitals:   10/05/18 1529 10/05/18 1647  BP: (!) 110/94 136/78  Pulse: 74 69  Resp:  14  Temp: (!) 36.4 C 36.6 C  SpO2: 99% 99%    Last Pain:  Vitals:   10/05/18 1647  TempSrc: Oral  PainSc:                  Elon Lomeli L Pax Reasoner

## 2018-10-05 NOTE — Transfer of Care (Signed)
Immediate Anesthesia Transfer of Care Note  Patient: Tamara Alvarado  Procedure(s) Performed: TOTAL KNEE ARTHROPLASTY (Right Knee)  Patient Location: PACU  Anesthesia Type:Spinal and MAC combined with regional for post-op pain  Level of Consciousness: awake, alert , oriented and patient cooperative  Airway & Oxygen Therapy: Patient Spontanous Breathing and Patient connected to face mask oxygen  Post-op Assessment: Report given to RN and Post -op Vital signs reviewed and stable  Post vital signs: Reviewed and stable  Last Vitals:  Vitals Value Taken Time  BP 112/68 10/05/2018 11:23 AM  Temp    Pulse 82 10/05/2018 11:24 AM  Resp 14 10/05/2018 11:24 AM  SpO2 100 % 10/05/2018 11:24 AM  Vitals shown include unvalidated device data.  Last Pain:  Vitals:   10/05/18 0717  TempSrc:   PainSc: 0-No pain      Patients Stated Pain Goal: 4 (48/54/62 7035)  Complications: No apparent anesthesia complications

## 2018-10-05 NOTE — Anesthesia Preprocedure Evaluation (Addendum)
Anesthesia Evaluation  Patient identified by MRN, date of birth, ID band Patient awake    Reviewed: Allergy & Precautions, NPO status , Patient's Chart, lab work & pertinent test results  History of Anesthesia Complications (+) PONV and history of anesthetic complications  Airway Mallampati: I  TM Distance: >3 FB Neck ROM: Full    Dental no notable dental hx. (+) Teeth Intact, Dental Advisory Given   Pulmonary neg pulmonary ROS, former smoker,    Pulmonary exam normal breath sounds clear to auscultation       Cardiovascular negative cardio ROS Normal cardiovascular exam Rhythm:Regular Rate:Normal     Neuro/Psych negative neurological ROS  negative psych ROS   GI/Hepatic negative GI ROS, Neg liver ROS,   Endo/Other  negative endocrine ROS  Renal/GU negative Renal ROS  negative genitourinary   Musculoskeletal  (+) Arthritis , Osteoarthritis,    Abdominal   Peds  Hematology negative hematology ROS (+)   Anesthesia Other Findings   Reproductive/Obstetrics                            Anesthesia Physical Anesthesia Plan  ASA: II  Anesthesia Plan: Spinal and Regional   Post-op Pain Management:  Regional for Post-op pain   Induction:   PONV Risk Score and Plan: 3 and Treatment may vary due to age or medical condition, Ondansetron, Dexamethasone, Propofol infusion and Midazolam  Airway Management Planned: Natural Airway  Additional Equipment:   Intra-op Plan:   Post-operative Plan:   Informed Consent: I have reviewed the patients History and Physical, chart, labs and discussed the procedure including the risks, benefits and alternatives for the proposed anesthesia with the patient or authorized representative who has indicated his/her understanding and acceptance.     Dental advisory given  Plan Discussed with: CRNA  Anesthesia Plan Comments:         Anesthesia Quick  Evaluation

## 2018-10-06 ENCOUNTER — Encounter (HOSPITAL_COMMUNITY): Payer: Self-pay | Admitting: Orthopedic Surgery

## 2018-10-06 DIAGNOSIS — M1711 Unilateral primary osteoarthritis, right knee: Secondary | ICD-10-CM | POA: Diagnosis not present

## 2018-10-06 LAB — CBC
HCT: 35.8 % — ABNORMAL LOW (ref 36.0–46.0)
Hemoglobin: 11 g/dL — ABNORMAL LOW (ref 12.0–15.0)
MCH: 30.1 pg (ref 26.0–34.0)
MCHC: 30.7 g/dL (ref 30.0–36.0)
MCV: 98.1 fL (ref 80.0–100.0)
Platelets: 229 10*3/uL (ref 150–400)
RBC: 3.65 MIL/uL — ABNORMAL LOW (ref 3.87–5.11)
RDW: 13.2 % (ref 11.5–15.5)
WBC: 10.7 10*3/uL — ABNORMAL HIGH (ref 4.0–10.5)
nRBC: 0 % (ref 0.0–0.2)

## 2018-10-06 LAB — BASIC METABOLIC PANEL
Anion gap: 7 (ref 5–15)
BUN: 18 mg/dL (ref 8–23)
CO2: 22 mmol/L (ref 22–32)
Calcium: 8.5 mg/dL — ABNORMAL LOW (ref 8.9–10.3)
Chloride: 108 mmol/L (ref 98–111)
Creatinine, Ser: 0.87 mg/dL (ref 0.44–1.00)
GFR calc Af Amer: >60 mL/min (ref >60)
GFR calc non Af Amer: >60 mL/min (ref >60)
Glucose, Bld: 127 mg/dL — ABNORMAL HIGH (ref 70–99)
Potassium: 3.7 mmol/L (ref 3.5–5.1)
Sodium: 137 mmol/L (ref 135–145)

## 2018-10-06 MED ORDER — ASPIRIN 325 MG PO TBEC: 325.0000 mg | DELAYED_RELEASE_TABLET | Freq: Two times a day (BID) | ORAL | 0 refills | Status: AC

## 2018-10-06 MED ORDER — METHOCARBAMOL 500 MG PO TABS: 500.0000 mg | ORAL_TABLET | Freq: Four times a day (QID) | ORAL | 0 refills | Status: AC | PRN

## 2018-10-06 MED ORDER — OXYCODONE HCL 5 MG PO TABS: 5.0000 mg | ORAL_TABLET | Freq: Four times a day (QID) | ORAL | 0 refills | Status: AC | PRN

## 2018-10-06 NOTE — Discharge Summary (Signed)
SPORTS MEDICINE & JOINT REPLACEMENT   Georgena Spurling, MD   Laurier Nancy, PA-C [address] [place], [place], [place]  [address]                             [phone]  PATIENT ID: Tamara Alvarado        MRN:  [id number]          DOB/AGE: [birth year] / 70 y.o.    DISCHARGE SUMMARY  ADMISSION DATE:    10/05/2018 DISCHARGE DATE:   10/06/2018   ADMISSION DIAGNOSIS: RT. KNEE OSTEOARTHRITIS    DISCHARGE DIAGNOSIS:  RT. KNEE OSTEOARTHRITIS    ADDITIONAL DIAGNOSIS: Active Problems:   S/P total knee replacement  Past Medical History:  Diagnosis Date  . 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 Arthritis    oa  . Complication of anesthesia    " felt like could not breathe, oxygen levels ok after cervical neck surgery few yrs ago  . Heart murmur    mild no cardiologist per pt  . PONV (postoperative nausea and vomiting)     PROCEDURE: Procedure(s): TOTAL KNEE ARTHROPLASTY on 10/05/2018  CONSULTS:    HISTORY:  See H&P in chart  HOSPITAL COURSE:  Toinette Lackie is a 68 y.o. admitted on 10/05/2018 and found to have a diagnosis of RT. KNEE OSTEOARTHRITIS.  After appropriate laboratory studies were obtained  they were taken to the operating room on 10/05/2018 and underwent Procedure(s): TOTAL KNEE ARTHROPLASTY.   They were given perioperative antibiotics:  Anti-infectives (From admission, onward)   Start     Dose/Rate Route Frequency Ordered Stop   10/05/18 1600  ceFAZolin (ANCEF) IVPB 2g/100 mL premix     2 g 200 mL/hr over 30 Minutes Intravenous Every 6 hours 10/05/18 1349 10/05/18 2221   10/05/18 0700  ceFAZolin (ANCEF) IVPB 2g/100 mL premix     2 g 200 mL/hr over 30 Minutes Intravenous On call to O.R. 10/05/18 8657 10/05/18 1007    .  Patient given tranexamic acid IV or topical and exparel intra-operatively.  Tolerated the procedure well.    POD# 1: Vital signs were stable.  Patient denied Chest pain, shortness of breath, or calf pain.  Patient was started on Aspirin  twice daily at 8am.  Consults to PT, OT, and care management were made.  The patient was weight bearing as tolerated.  CPM was placed on the operative leg 0-90 degrees for 6-8 hours a day. When out of the CPM, patient was placed in the foam block to achieve full extension. Incentive spirometry was taught.  Dressing was changed.       POD #2, Continued  PT for ambulation and exercise program.  IV saline locked.  O2 discontinued.    The remainder of the hospital course was dedicated to ambulation and strengthening.   The patient was discharged on 1 Day Post-Op in  Good condition.  Blood products given:none  DIAGNOSTIC STUDIES: Recent vital signs:  Patient Vitals for the past 24 hrs:  BP Temp Temp src Pulse Resp SpO2 Height Weight  10/06/18 0518 123/72 (!) 97.5 F (36.4 C) Oral 71 14 99 % - -  10/06/18 0058 109/68 - - 74 14 99 % - -  10/05/18 1647 136/78 97.8 F (36.6 C) Oral 69 14 99 % - -  10/05/18 1529 (!) 110/94 (!) 97.5 F (36.4 C) Oral 74 - 99 % - -  10/05/18 1442 121/78 97.7  F (36.5 C) - 67 15 100 % - -  10/05/18 1337 128/77 (!) 97.5 F (36.4 C) Oral 66 14 96 % - -  10/05/18 1320 127/74 - - 63 12 100 % - -  10/05/18 1315 132/75 - - 61 12 100 % - -  10/05/18 1300 127/74 - - 66 11 100 % - -  10/05/18 1245 126/71 - - 71 12 100 % - -  10/05/18 1230 127/80 - - 68 14 100 % - -  10/05/18 1215 130/72 - - (!) 57 11 100 % - -  10/05/18 1200 122/68 - - 67 18 100 % - -  10/05/18 1145 121/69 - - 64 15 100 % - -  10/05/18 1130 108/67 - - 79 16 100 % - -  10/05/18 1122 112/68 (!) 97.5 F (36.4 C) - 80 18 100 % - -  10/05/18 0930 (!) 145/123 - - 75 (!) 21 97 % - -  10/05/18 0925 135/75 - - 82 16 97 % - -  10/05/18 0920 137/74 - - 77 16 99 % - -  10/05/18 0918 - - - 76 18 100 % - -  10/05/18 0917 - - - 72 12 99 % - -  10/05/18 0916 - - - 83 18 98 % - -  10/05/18 0915 135/78 - - - - - - -  10/05/18 0914 137/76 - - 86 17 99 % - -  10/05/18 0913 - - - 86 20 100 % - -  10/05/18 0912 - -  - 80 18 97 % - -  10/05/18 0911 - - - 81 20 99 % - -  10/05/18 0910 - - - 81 (!) 29 100 % - -  10/05/18 0909 - - - 78 16 100 % - -  10/05/18 0908 - - - 83 (!) 23 100 % - -  10/05/18 0907 - - - 75 18 97 % - -  10/05/18 0906 138/86 - - 75 16 - - -  10/05/18 0717 - - - - - - 5\' 3"  (1.6 m) 78.9 kg  10/05/18 0703 (!) 146/83 98 F (36.7 C) Oral 73 16 99 % - -       Recent laboratory studies: Recent Labs    09/30/18 1102 10/06/18 0402  WBC 5.1 10.7*  HGB 13.3 11.0*  HCT 42.5 35.8*  PLT 273 229   Recent Labs    09/30/18 1102 10/06/18 0402  NA 139 137  K 4.5 3.7  CL 105 108  CO2 25 22  BUN 21 18  CREATININE 0.88 0.87  GLUCOSE 99 127*  CALCIUM 9.4 8.5*   No results found for: INR, PROTIME   Recent Radiographic Studies :  No results found.  DISCHARGE INSTRUCTIONS: Discharge Instructions    Call MD / Call 911   Complete by:  As directed    If you experience chest pain or shortness of breath, CALL 911 and be transported to the hospital emergency room.  If you develope a fever above 101 F, pus (white drainage) or increased drainage or redness at the wound, or calf pain, call your surgeon's office.   Constipation Prevention   Complete by:  As directed    Drink plenty of fluids.  Prune juice may be helpful.  You may use a stool softener, such as Colace (over the counter) 100 mg twice a day.  Use MiraLax (over the counter) for constipation as needed.   Diet - low sodium heart  healthy   Complete by:  As directed    Discharge instructions   Complete by:  As directed    INSTRUCTIONS AFTER JOINT REPLACEMENT   Remove items at home which could result in a fall. This includes throw rugs or furniture in walking pathways ICE to the affected joint every three hours while awake for 30 minutes at a time, for at least the first 3-5 days, and then as needed for pain and swelling.  Continue to use ice for pain and swelling. You may notice swelling that will progress down to the foot and ankle.   This is normal after surgery.  Elevate your leg when you are not up walking on it.   Continue to use the breathing machine you got in the hospital (incentive spirometer) which will help keep your temperature down.  It is common for your temperature to cycle up and down following surgery, especially at night when you are not up moving around and exerting yourself.  The breathing machine keeps your lungs expanded and your temperature down.   DIET:  As you were doing prior to hospitalization, we recommend a well-balanced diet.  DRESSING / WOUND CARE / SHOWERING  Keep the surgical dressing until follow up.  The dressing is water proof, so you can shower without any extra covering.  IF THE DRESSING FALLS OFF or the wound gets wet inside, change the dressing with sterile gauze.  Please use good hand washing techniques before changing the dressing.  Do not use any lotions or creams on the incision until instructed by your surgeon.    ACTIVITY  Increase activity slowly as tolerated, but follow the weight bearing instructions below.   No driving for 6 weeks or until further direction given by your physician.  You cannot drive while taking narcotics.  No lifting or carrying greater than 10 lbs. until further directed by your surgeon. Avoid periods of inactivity such as sitting longer than an hour when not asleep. This helps prevent blood clots.  You may return to work once you are authorized by your doctor.     WEIGHT BEARING   Weight bearing as tolerated with assist device (walker, cane, etc) as directed, use it as long as suggested by your surgeon or therapist, typically at least 4-6 weeks.   EXERCISES  Results after joint replacement surgery are often greatly improved when you follow the exercise, range of motion and muscle strengthening exercises prescribed by your doctor. Safety measures are also important to protect the joint from further injury. Any time any of these exercises cause you to  have increased pain or swelling, decrease what you are doing until you are comfortable again and then slowly increase them. If you have problems or questions, call your caregiver or physical therapist for advice.   Rehabilitation is important following a joint replacement. After just a few days of immobilization, the muscles of the leg can become weakened and shrink (atrophy).  These exercises are designed to build up the tone and strength of the thigh and leg muscles and to improve motion. Often times heat used for twenty to thirty minutes before working out will loosen up your tissues and help with improving the range of motion but do not use heat for the first two weeks following surgery (sometimes heat can increase post-operative swelling).   These exercises can be done on a training (exercise) mat, on the floor, on a table or on a bed. Use whatever works the best and is most  comfortable for you.    Use music or television while you are exercising so that the exercises are a pleasant break in your day. This will make your life better with the exercises acting as a break in your routine that you can look forward to.   Perform all exercises about fifteen times, three times per day or as directed.  You should exercise both the operative leg and the other leg as well.   Exercises include:   Quad Sets - Tighten up the muscle on the front of the thigh (Quad) and hold for 5-10 seconds.   Straight Leg Raises - With your knee straight (if you were given a brace, keep it on), lift the leg to 60 degrees, hold for 3 seconds, and slowly lower the leg.  Perform this exercise against resistance later as your leg gets stronger.  Leg Slides: Lying on your back, slowly slide your foot toward your buttocks, bending your knee up off the floor (only go as far as is comfortable). Then slowly slide your foot back down until your leg is flat on the floor again.  Angel Wings: Lying on your back spread your legs to the side as  far apart as you can without causing discomfort.  Hamstring Strength:  Lying on your back, push your heel against the floor with your leg straight by tightening up the muscles of your buttocks.  Repeat, but this time bend your knee to a comfortable angle, and push your heel against the floor.  You may put a pillow under the heel to make it more comfortable if necessary.   A rehabilitation program following joint replacement surgery can speed recovery and prevent re-injury in the future due to weakened muscles. Contact your doctor or a physical therapist for more information on knee rehabilitation.    CONSTIPATION  Constipation is defined medically as fewer than three stools per week and severe constipation as less than one stool per week.  Even if you have a regular bowel pattern at home, your normal regimen is likely to be disrupted due to multiple reasons following surgery.  Combination of anesthesia, postoperative narcotics, change in appetite and fluid intake all can affect your bowels.   YOU MUST use at least one of the following options; they are listed in order of increasing strength to get the job done.  They are all available over the counter, and you may need to use some, POSSIBLY even all of these options:    Drink plenty of fluids (prune juice may be helpful) and high fiber foods Colace 100 mg by mouth twice a day  Senokot for constipation as directed and as needed Dulcolax (bisacodyl), take with full glass of water  Miralax (polyethylene glycol) once or twice a day as needed.  If you have tried all these things and are unable to have a bowel movement in the first 3-4 days after surgery call either your surgeon or your primary doctor.    If you experience loose stools or diarrhea, hold the medications until you stool forms back up.  If your symptoms do not get better within 1 week or if they get worse, check with your doctor.  If you experience "the worst abdominal pain ever" or  develop nausea or vomiting, please contact the office immediately for further recommendations for treatment.   ITCHING:  If you experience itching with your medications, try taking only a single pain pill, or even half a pain pill at a time.  You  can also use Benadryl over the counter for itching or also to help with sleep.   TED HOSE STOCKINGS:  Use stockings on both legs until for at least 2 weeks or as directed by physician office. They may be removed at night for sleeping.  MEDICATIONS:  See your medication summary on the "After Visit Summary" that nursing will review with you.  You may have some home medications which will be placed on hold until you complete the course of blood thinner medication.  It is important for you to complete the blood thinner medication as prescribed.  PRECAUTIONS:  If you experience chest pain or shortness of breath - call 911 immediately for transfer to the hospital emergency department.   If you develop a fever greater that 101 F, purulent drainage from wound, increased redness or drainage from wound, foul odor from the wound/dressing, or calf pain - CONTACT YOUR SURGEON.                                                   FOLLOW-UP APPOINTMENTS:  If you do not already have a post-op appointment, please call the office for an appointment to be seen by your surgeon.  Guidelines for how soon to be seen are listed in your "After Visit Summary", but are typically between 1-4 weeks after surgery.  OTHER INSTRUCTIONS:   Knee Replacement:  Do not place pillow under knee, focus on keeping the knee straight while resting. CPM instructions: 0-90 degrees, 2 hours in the morning, 2 hours in the afternoon, and 2 hours in the evening. Place foam block, curve side up under heel at all times except when in CPM or when walking.  DO NOT modify, tear, cut, or change the foam block in any way.  MAKE SURE YOU:  Understand these instructions.  Get help right away if you are not doing  well or get worse.    Thank you for letting us be a part of your medical care team.  It is a privilege we respect greatly.  We hope these instructions will help you stay on track for a fast and full recovery!   Increase activity slowly as tolerated   Complete by:  As directed       DISCHARGE MEDICATIONS:   Allergies as of 10/06/2018      Reactions   Latex Other (See Comments)   Skin peels off   Levofloxacin Other (See Comments)   Joint pain   Penicillins Hives, Swelling, Rash   DID THE REACTION INVOLVE: Swelling of the face/tongue/throat, SOB, or low BP? No Sudden or severe rash/hives, skin peeling, or the inside of the mouth or nose? No Did it require medical treatment? No When did it last happen?20 years ago If all above answers are "NO", may proceed with cephalosporin use.   Sulfa Antibiotics Hives, Swelling, Rash      Medication List    TAKE these medications   aspirin 325 MG EC tablet Take 1 tablet (325 mg total) by mouth 2 (two) times daily.   HAIR/SKIN/NAILS PO Take 1 tablet by mouth daily.   Magnesium 250 MG Tabs Take 250 mg by mouth daily.   methocarbamol 500 MG tablet Commonly known as:  ROBAXIN Take 1-2 tablets (500-1,000 mg total) by mouth every 6 (six) hours as needed for muscle spasms.   oxyCODONE 5  MG immediate release tablet Commonly known as:  Oxy IR/ROXICODONE Take 1-2 tablets (5-10 mg total) by mouth every 6 (six) hours as needed for moderate pain (pain score 4-6).   vitamin C 500 MG tablet Commonly known as:  ASCORBIC ACID Take 500 mg by mouth 2 (two) times daily.            Durable Medical Equipment  (From admission, onward)         Start     Ordered   10/05/18 1350  DME 3 n 1  Once     10/05/18 1349   10/05/18 1350  DME Bedside commode  Once    Question:  Patient needs a bedside commode to treat with the following condition  Answer:  S/P total knee replacement   10/05/18 1349   10/05/18 1206  DME Walker rolling  Once     Question:  Patient needs a walker to treat with the following condition  Answer:  S/P total knee replacement   10/05/18 1205          FOLLOW UP VISIT:    DISPOSITION: HOME VS. SNF  CONDITION:  Good   [name] 10/06/2018, 6:53 AM

## 2018-10-06 NOTE — Progress Notes (Signed)
Physical Therapy Treatment Patient Details Name: Tamara Alvarado MRN: [id number] DOB: 02/17/1951 Today's Date: 10/06/2018    History of Present Illness Pt s/p R TKR and with hx of bil THR and back surgery    PT Comments    Patient seen for mobility progression and exercise instruction. Does require some active assist from PT for SLR and heel slides, otherwise tolerates all activities without issue. Good progression of gait mechanics with cueing from PT for step through pattern. Education/training on stair navigation - however, will need further training as patient will not have rails to assist with stair navigation at home. Will need RW and 3in1 at discharge. PT to continue to follow with patient requesting 1 additional PT visit prior to discharge.    Follow Up Recommendations  Follow surgeon's recommendation for DC plan and follow-up therapies     Equipment Recommendations  Rolling walker with 5" wheels;3in1 (PT)    Recommendations for Other Services       Precautions / Restrictions Precautions Precautions: Knee;Fall Restrictions Weight Bearing Restrictions: No Other Position/Activity Restrictions: WBAT    Mobility  Bed Mobility               General bed mobility comments: up in chair upon PT arrival  Transfers Overall transfer level: Needs assistance Equipment used: Rolling walker (2 wheeled) Transfers: Sit to/from Stand Sit to Stand: Min guard         General transfer comment: min guard for safety and immediate standing balance; cueing for and placement and sequencing  Ambulation/Gait Ambulation/Gait assistance: Min assist;Min guard Gait Distance (Feet): 150 Feet Assistive device: Rolling walker (2 wheeled) Gait Pattern/deviations: Step-to pattern;Step-through pattern;Decreased step length - right;Decreased step length - left;Decreased weight shift to right;Antalgic Gait velocity: decreased   General Gait Details: mild antalgic gait pattern with initial  step to pattern - with cueing from PT able to progress to step through   Stairs Stairs: Yes Stairs assistance: Min assist;Min guard Stair Management: Two rails;Step to pattern;Forwards Number of Stairs: 2 General stair comments: cueing for safety and sequencing - does not have rails at home - will need to practice using husband/PT for assist   Wheelchair Mobility    Modified Rankin (Stroke Patients Only)       Balance Overall balance assessment: Mild deficits observed, not formally tested                                          Cognition Arousal/Alertness: Awake/alert Behavior During Therapy: WFL for tasks assessed/performed Overall Cognitive Status: Within Functional Limits for tasks assessed                                        Exercises Total Joint Exercises Ankle Circles/Pumps: AROM;Both;10 reps;Seated Quad Sets: AROM;Right;10 reps;Seated Gluteal Sets: AROM;Both;10 reps;Seated Heel Slides: AAROM;Right;10 reps;Seated Hip ABduction/ADduction: AROM;Right;10 reps;Seated Straight Leg Raises: AAROM;Right;10 reps;Seated Long Arc Quad: AROM;Right;10 reps;Seated Knee Flexion: AAROM;Right;10 reps;Seated    General Comments        Pertinent Vitals/Pain Pain Assessment: 0-10 Pain Score: 3  Pain Location: R knee Pain Descriptors / Indicators: Aching;Sore;Tightness Pain Intervention(s): Limited activity within patient's tolerance;Monitored during session;Premedicated before session;Repositioned;Ice applied    Home Living  Prior Function            PT Goals (current goals can now be found in the care plan section) Acute Rehab PT Goals Patient Stated Goal: Regain IND PT Goal Formulation: With patient Time For Goal Achievement: 10/12/18 Potential to Achieve Goals: Good Progress towards PT goals: Progressing toward goals    Frequency    7X/week      PT Plan Current plan remains appropriate     Co-evaluation              AM-PAC PT "6 Clicks" Mobility   Outcome Measure  Help needed turning from your back to your side while in a flat bed without using bedrails?: A Little Help needed moving from lying on your back to sitting on the side of a flat bed without using bedrails?: A Little Help needed moving to and from a bed to a chair (including a wheelchair)?: A Little Help needed standing up from a chair using your arms (e.g., wheelchair or bedside chair)?: A Little Help needed to walk in hospital room?: A Little Help needed climbing 3-5 steps with a railing? : A Little 6 Click Score: 18    End of Session Equipment Utilized During Treatment: Gait belt Activity Tolerance: Patient tolerated treatment well Patient left: in chair;with call bell/phone within reach Nurse Communication: Mobility status PT Visit Diagnosis: Unsteadiness on feet (R26.81);Other abnormalities of gait and mobility (R26.89);Muscle weakness (generalized) (M62.81)     Time: 2025-4270 PT Time Calculation (min) (ACUTE ONLY): 26 min  Charges:  $Gait Training: 8-22 mins $Therapeutic Exercise: 8-22 mins                      Kipp Laurence, PT, DPT Supplemental Physical Therapist 10/06/18 8:48 AM Pager: [phone] Office: [phone]

## 2018-10-06 NOTE — Care Management Obs Status (Signed)
MEDICARE OBSERVATION STATUS NOTIFICATION   Patient Details  Name: Tamara DeistLinda Mcgurn MRN: [id number] Date of Birth: August 13, 1951   Medicare Observation Status Notification Given:  Yes    Alexis Goodelleele, Seyon Strader K, RN 10/06/2018, 11:16 AM

## 2018-10-06 NOTE — Op Note (Signed)
TOTAL KNEE REPLACEMENT OPERATIVE NOTE:  10/05/2018  2:24 PM  PATIENT:  Tamara Alvarado  68 y.o. female  PRE-OPERATIVE DIAGNOSIS:  RT. KNEE OSTEOARTHRITIS  POST-OPERATIVE DIAGNOSIS:  RT. KNEE OSTEOARTHRITIS  PROCEDURE:  Procedure(s): TOTAL KNEE ARTHROPLASTY  SURGEON:  Surgeon(s): Dannielle Huh, MD  PHYSICIAN ASSISTANT: Laurier Nancy, PA-C   ANESTHESIA:   spinal  SPECIMEN: None  COUNTS:  Correct  TOURNIQUET:   Total Tourniquet Time Documented: Thigh (Right) - 34 minutes Total: Thigh (Right) - 34 minutes   DICTATION:  Indication for procedure:    The patient is a 68 y.o. female who has failed conservative treatment for RT. KNEE OSTEOARTHRITIS.  Informed consent was obtained prior to anesthesia. The risks versus benefits of the operation were explain and in a way the patient can, and did, understand.   On the implant demand matching protocol, this patient scored 9.  Therefore, this patient was not receive a polyethylene insert with vitamin E which is a high demand implant.  Description of procedure:     The patient was taken to the operating room and placed under anesthesia.  The patient was positioned in the usual fashion taking care that all body parts were adequately padded and/or protected.  A tourniquet was applied and the leg prepped and draped in the usual sterile fashion.  The extremity was exsanguinated with the esmarch and tourniquet inflated to 350 mmHg.  Pre-operative range of motion was normal.  The knee was in 5 degree of mild valgus.  A midline incision approximately 6-7 inches long was made with a #10 blade.  A new blade was used to make a parapatellar arthrotomy going 2-3 cm into the quadriceps tendon, over the patella, and alongside the medial aspect of the patellar tendon.  A synovectomy was then performed with the #10 blade and forceps. I then elevated the deep MCL off the medial tibial metaphysis subperiosteally around to the semimembranosus attachment.    I  everted the patella and used calipers to measure patellar thickness.  I used the reamer to ream down to appropriate thickness to recreate the native thickness.  I then removed excess bone with the rongeur and sagittal saw.  I used the appropriately sized template and drilled the three lug holes.  I then put the trial in place and measured the thickness with the calipers to ensure recreation of the native thickness.  The trial was then removed and the patella subluxed and the knee brought into flexion.  A homan retractor was place to retract and protect the patella and lateral structures.  A Z-retractor was place medially to protect the medial structures.  The extra-medullary alignment system was used to make cut the tibial articular surface perpendicular to the anamotic axis of the tibia and in 3 degrees of posterior slope.  The cut surface and alignment jig was removed.  I then used the intramedullary alignment guide to make a 4 valgus cut on the distal femur.  I then marked out the epicondylar axis on the distal femur.  The posterior condylar axis measured 7 degrees.  I then used the anterior referencing sizer and measured the femur to be a size 7.  The 4-In-1 cutting block was screwed into place in external rotation matching the posterior condylar angle, making our cuts perpendicular to the epicondylar axis.  Anterior, posterior and chamfer cuts were made with the sagittal saw.  The cutting block and cut pieces were removed.  A lamina spreader was placed in 90 degrees of flexion.  The ACL, PCL, menisci, and posterior condylar osteophytes were removed.  A 10 mm spacer blocked was found to offer good flexion and extension gap balance after moderate in degree releasing.   The scoop retractor was then placed and the femoral finishing block was pinned in place.  The small sagittal saw was used as well as the lug drill to finish the femur.  The block and cut surfaces were removed and the medullary canal hole  filled with autograft bone from the cut pieces.  The tibia was delivered forward in deep flexion and external rotation.  A size D tray was selected and pinned into place centered on the medial 1/3 of the tibial tubercle.  The reamer and keel was used to prepare the tibia through the tray.    I then trialed with the size 6 femur, size D tibia, a 10 mm insert and the 32 patella.  I had excellent flexion/extension gap balance, excellent patella tracking.  Flexion was full and beyond 120 degrees; extension was zero.  These components were chosen and the staff opened them to me on the back table while the knee was lavaged copiously and the cement mixed.  The soft tissue was infiltrated with 60cc of exparel 1.3% through a 21 gauge needle.  I cemented in the components and removed all excess cement.  The polyethylene tibial component was snapped into place and the knee placed in extension while cement was hardening.  The capsule was infilltrated with a 60cc exparel/marcaine/saline mixture.   Once the cement was hard, the tourniquet was let down.  Hemostasis was obtained.  The arthrotomy was closed using a #1 stratofix running suture.  The deep soft tissues were closed with #0 vicryls and the subcuticular layer closed with #2-0 vicryl.  The skin was reapproximated and closed with 3.0 Monocryl.  The wound was covered with steristrips, aquacel dressing, and a TED stocking.   The patient was then awakened, extubated, and taken to the recovery room in stable condition.  BLOOD LOSS:  818EX COMPLICATIONS:  None.  PLAN OF CARE: Admit for overnight observation  PATIENT DISPOSITION:  PACU - hemodynamically stable.   Delay start of Pharmacological VTE agent (>24hrs) due to surgical blood loss or risk of bleeding:  not applicable  Please fax a copy of this op note to my office at [phone] (please only include page 1 and 2 of the Case Information op note)

## 2018-10-06 NOTE — Progress Notes (Signed)
SPORTS MEDICINE AND JOINT REPLACEMENT  Tamara Spurling, MD    Tamara Nancy, PA-C [address] [place], [place], [place]  [address]                             [phone]   PROGRESS NOTE  Subjective:  negative for Chest Pain  negative for Shortness of Breath  negative for Nausea/Vomiting   negative for Calf Pain  negative for Bowel Movement   Tolerating Diet: yes         Patient reports pain as 3 on 0-10 scale.    Objective: Vital signs in last 24 hours:    Patient Vitals for the past 24 hrs:  BP Temp Temp src Pulse Resp SpO2 Height Weight  10/06/18 0518 123/72 (!) 97.5 F (36.4 C) Oral 71 14 99 % - -  10/06/18 0058 109/68 - - 74 14 99 % - -  10/05/18 1647 136/78 97.8 F (36.6 C) Oral 69 14 99 % - -  10/05/18 1529 (!) 110/94 (!) 97.5 F (36.4 C) Oral 74 - 99 % - -  10/05/18 1442 121/78 97.7 F (36.5 C) - 67 15 100 % - -  10/05/18 1337 128/77 (!) 97.5 F (36.4 C) Oral 66 14 96 % - -  10/05/18 1320 127/74 - - 63 12 100 % - -  10/05/18 1315 132/75 - - 61 12 100 % - -  10/05/18 1300 127/74 - - 66 11 100 % - -  10/05/18 1245 126/71 - - 71 12 100 % - -  10/05/18 1230 127/80 - - 68 14 100 % - -  10/05/18 1215 130/72 - - (!) 57 11 100 % - -  10/05/18 1200 122/68 - - 67 18 100 % - -  10/05/18 1145 121/69 - - 64 15 100 % - -  10/05/18 1130 108/67 - - 79 16 100 % - -  10/05/18 1122 112/68 (!) 97.5 F (36.4 C) - 80 18 100 % - -  10/05/18 0930 (!) 145/123 - - 75 (!) 21 97 % - -  10/05/18 0925 135/75 - - 82 16 97 % - -  10/05/18 0920 137/74 - - 77 16 99 % - -  10/05/18 0918 - - - 76 18 100 % - -  10/05/18 0917 - - - 72 12 99 % - -  10/05/18 0916 - - - 83 18 98 % - -  10/05/18 0915 135/78 - - - - - - -  10/05/18 0914 137/76 - - 86 17 99 % - -  10/05/18 0913 - - - 86 20 100 % - -  10/05/18 0912 - - - 80 18 97 % - -  10/05/18 0911 - - - 81 20 99 % - -  10/05/18 0910 - - - 81 (!) 29 100 % - -  10/05/18 0909 - - - 78 16 100 % - -  10/05/18 0908 - - - 83 (!) 23 100 % - -   10/05/18 0907 - - - 75 18 97 % - -  10/05/18 0906 138/86 - - 75 16 - - -  10/05/18 0717 - - - - - - 5\' 3"  (1.6 m) 78.9 kg  10/05/18 0703 (!) 146/83 98 F (36.7 C) Oral 73 16 99 % - -    @flow {1959:LAST@   Intake/Output from previous day:   01/20 0701 - 01/21 0700 In: 3872.9 [P.O.:840; I.V.:2653.8] Out:  1950 [Urine:1850]   Intake/Output this shift:   01/20 1901 - 01/21 0700 In: 1135.5 [P.O.:120; I.V.:900] Out: 600 [Urine:600]   Intake/Output      01/20 0701 - 01/21 0700   P.O. 840   I.V. (mL/kg) 2653.8 (33.6)   IV Piggyback 379.2   Total Intake(mL/kg) 3872.9 (49.1)   Urine (mL/kg/hr) 1850 (1)   Stool 0   Blood 100   Total Output 1950   Net +1922.9          LABORATORY DATA: Recent Labs    09/30/18 1102 10/06/18 0402  WBC 5.1 10.7*  HGB 13.3 11.0*  HCT 42.5 35.8*  PLT 273 229   Recent Labs    09/30/18 1102 10/06/18 0402  NA 139 137  K 4.5 3.7  CL 105 108  CO2 25 22  BUN 21 18  CREATININE 0.88 0.87  GLUCOSE 99 127*  CALCIUM 9.4 8.5*   No results found for: INR, PROTIME  Examination:  General appearance: alert, cooperative and no distress Extremities: extremities normal, atraumatic, no cyanosis or edema  Wound Exam: clean, dry, intact   Drainage:  None: wound tissue dry  Motor Exam: Quadriceps and Hamstrings Intact  Sensory Exam: Superficial Peroneal, Deep Peroneal and Tibial normal   Assessment:    1 Day Post-Op  Procedure(s) (LRB): TOTAL KNEE ARTHROPLASTY (Right)  ADDITIONAL DIAGNOSIS:  Active Problems:   S/P total knee replacement     Plan: Physical Therapy as ordered Weight Bearing as Tolerated (WBAT)  DVT Prophylaxis:  Aspirin  DISCHARGE PLAN: Home  DISCHARGE NEEDS: HHPT       Patient's anticipated LOS is less than 2 midnights, meeting these requirements: - Lives within 1 hour of care - Has a competent adult at home to recover with post-op recover - NO history of  - Chronic pain requiring opiods  - Diabetes  -  Coronary Artery Disease  - Heart failure  - Heart attack  - Stroke  - DVT/VTE  - Cardiac arrhythmia  - Respiratory Failure/COPD  - Renal failure  - Anemia  - Advanced Liver disease        Tamara SandiferColby Alan Haruye Alvarado 10/06/2018, 6:50 AM

## 2018-10-06 NOTE — Progress Notes (Signed)
Physical Therapy Treatment Patient Details Name: Tamara Alvarado MRN: [id number] DOB: [birth year] Today's Date: 10/06/2018    History of Present Illness Pt s/p R TKR and with hx of bil THR and back surgery    PT Comments    Patient doing well this afternoon - husband present for carryover of stair navigation education. Patient ambulating with RW with good step through pattern without cueing - mild antalgic gait pattern throughout. 1 HHA for stair navigation x 2 with PT first instructing patient and husband and then husband practicing with patient with good carryover.  Education on use of blue foam to promote extension with all questions answered. From a mobility standpoint patient ready for discharge when medically appropriate.    Follow Up Recommendations  Follow surgeon's recommendation for DC plan and follow-up therapies     Equipment Recommendations  Rolling walker with 5" wheels;3in1 (PT)    Recommendations for Other Services       Precautions / Restrictions Precautions Precautions: Knee;Fall Restrictions Weight Bearing Restrictions: No Other Position/Activity Restrictions: WBAT    Mobility  Bed Mobility               General bed mobility comments: up in restroom upon PT arrival  Transfers Overall transfer level: Needs assistance Equipment used: Rolling walker (2 wheeled) Transfers: Sit to/from Stand Sit to Stand: Supervision         General transfer comment: supervision for transfers - no LOB; good safety awareness  Ambulation/Gait Ambulation/Gait assistance: Min guard;Supervision Gait Distance (Feet): 300 Feet Assistive device: Rolling walker (2 wheeled) Gait Pattern/deviations: Decreased weight shift to right;Antalgic;Step-through pattern;Decreased stride length Gait velocity: decreased   General Gait Details: mild antalgic gait pattern; good step through pattern without cueing; some cueing to increase heel strike   Stairs Stairs: Yes Stairs  assistance: Min guard Stair Management: No rails;Step to pattern(1 HHA from PT; education on 1 HHA with husband) Number of Stairs: 3(2 sets - 8") General stair comments: 1 HHA to simulate home environment; step to pattern with good stability   Wheelchair Mobility    Modified Rankin (Stroke Patients Only)       Balance Overall balance assessment: Mild deficits observed, not formally tested                                          Cognition Arousal/Alertness: Awake/alert Behavior During Therapy: WFL for tasks assessed/performed Overall Cognitive Status: Within Functional Limits for tasks assessed                                        Exercises Total Joint Exercises Ankle Circles/Pumps: AROM;Both;10 reps;Seated Quad Sets: AROM;Right;10 reps;Seated Long Arc Quad: AROM;Right;15 reps;Seated    General Comments        Pertinent Vitals/Pain Pain Assessment: 0-10 Pain Score: 2  Pain Location: R knee Pain Descriptors / Indicators: Aching;Sore;Tightness Pain Intervention(s): Monitored during session;Limited activity within patient's tolerance;Repositioned    Home Living                      Prior Function            PT Goals (current goals can now be found in the care plan section) Acute Rehab PT Goals Patient Stated Goal: Regain IND PT Goal Formulation: With patient Time For  Goal Achievement: 10/12/18 Potential to Achieve Goals: Good Progress towards PT goals: Progressing toward goals    Frequency    7X/week      PT Plan Current plan remains appropriate    Co-evaluation              AM-PAC PT "6 Clicks" Mobility   Outcome Measure  Help needed turning from your back to your side while in a flat bed without using bedrails?: A Little Help needed moving from lying on your back to sitting on the side of a flat bed without using bedrails?: A Little Help needed moving to and from a bed to a chair (including a  wheelchair)?: A Little Help needed standing up from a chair using your arms (e.g., wheelchair or bedside chair)?: A Little Help needed to walk in hospital room?: A Little Help needed climbing 3-5 steps with a railing? : A Little 6 Click Score: 18    End of Session Equipment Utilized During Treatment: Gait belt Activity Tolerance: Patient tolerated treatment well Patient left: in chair;with call bell/phone within reach;with family/visitor present Nurse Communication: Mobility status PT Visit Diagnosis: Unsteadiness on feet (R26.81);Other abnormalities of gait and mobility (R26.89);Muscle weakness (generalized) (M62.81)     Time: 2620-3559 PT Time Calculation (min) (ACUTE ONLY): 24 min  Charges:  $Gait Training: 8-22 mins $Therapeutic Exercise: 8-22 mins                     Kipp Laurence, PT, DPT Supplemental Physical Therapist 10/06/18 12:49 PM Pager: [phone] Office: [phone]

## 2018-10-06 NOTE — Care Management Note (Signed)
Case Management Note  Patient Details  Name: Ruhama Gawron MRN: [id number] Date of Birth: 1951/01/17  Subjective/Objective:    Discharge planning, spoke with patient and spouse at bedside. Have chosen [facility] for [facility] PT, evaluate and treat.            Action/Plan: Contacted [facility] for referral. They have accepted. Needs RW and 3n1, [employer] delivered to room. [phone]       Expected Discharge Date:  10/06/18               Expected Discharge Plan:  Home w Home Health Services  In-House Referral:  NA  Discharge planning Services  CM Consult  Post Acute Care Choice:  Home Health Choice offered to:  Patient, Spouse  DME Arranged:  N/A DME Agency:  NA  HH Arranged:  PT HH Agency:  [facility] (formerly [facility]  Status of Service:  Completed, signed off  If discussed at [employer], dates discussed:    Additional Comments:  Alexis Goodell, RN 10/06/2018, 11:19 AM

## 2019-10-05 IMAGING — DX DG HIP (WITH OR WITHOUT PELVIS) 2-3V*R*
3 series · 3 of 3 positions shown · non-contrast
Comparison: None.

CLINICAL DATA: Right hip pain for 1 month

EXAM:
DG HIP (WITH OR WITHOUT PELVIS) 2-3V RIGHT

[dg hip unilat w or w/o pelvis 2-3 views  (1 of 3)]
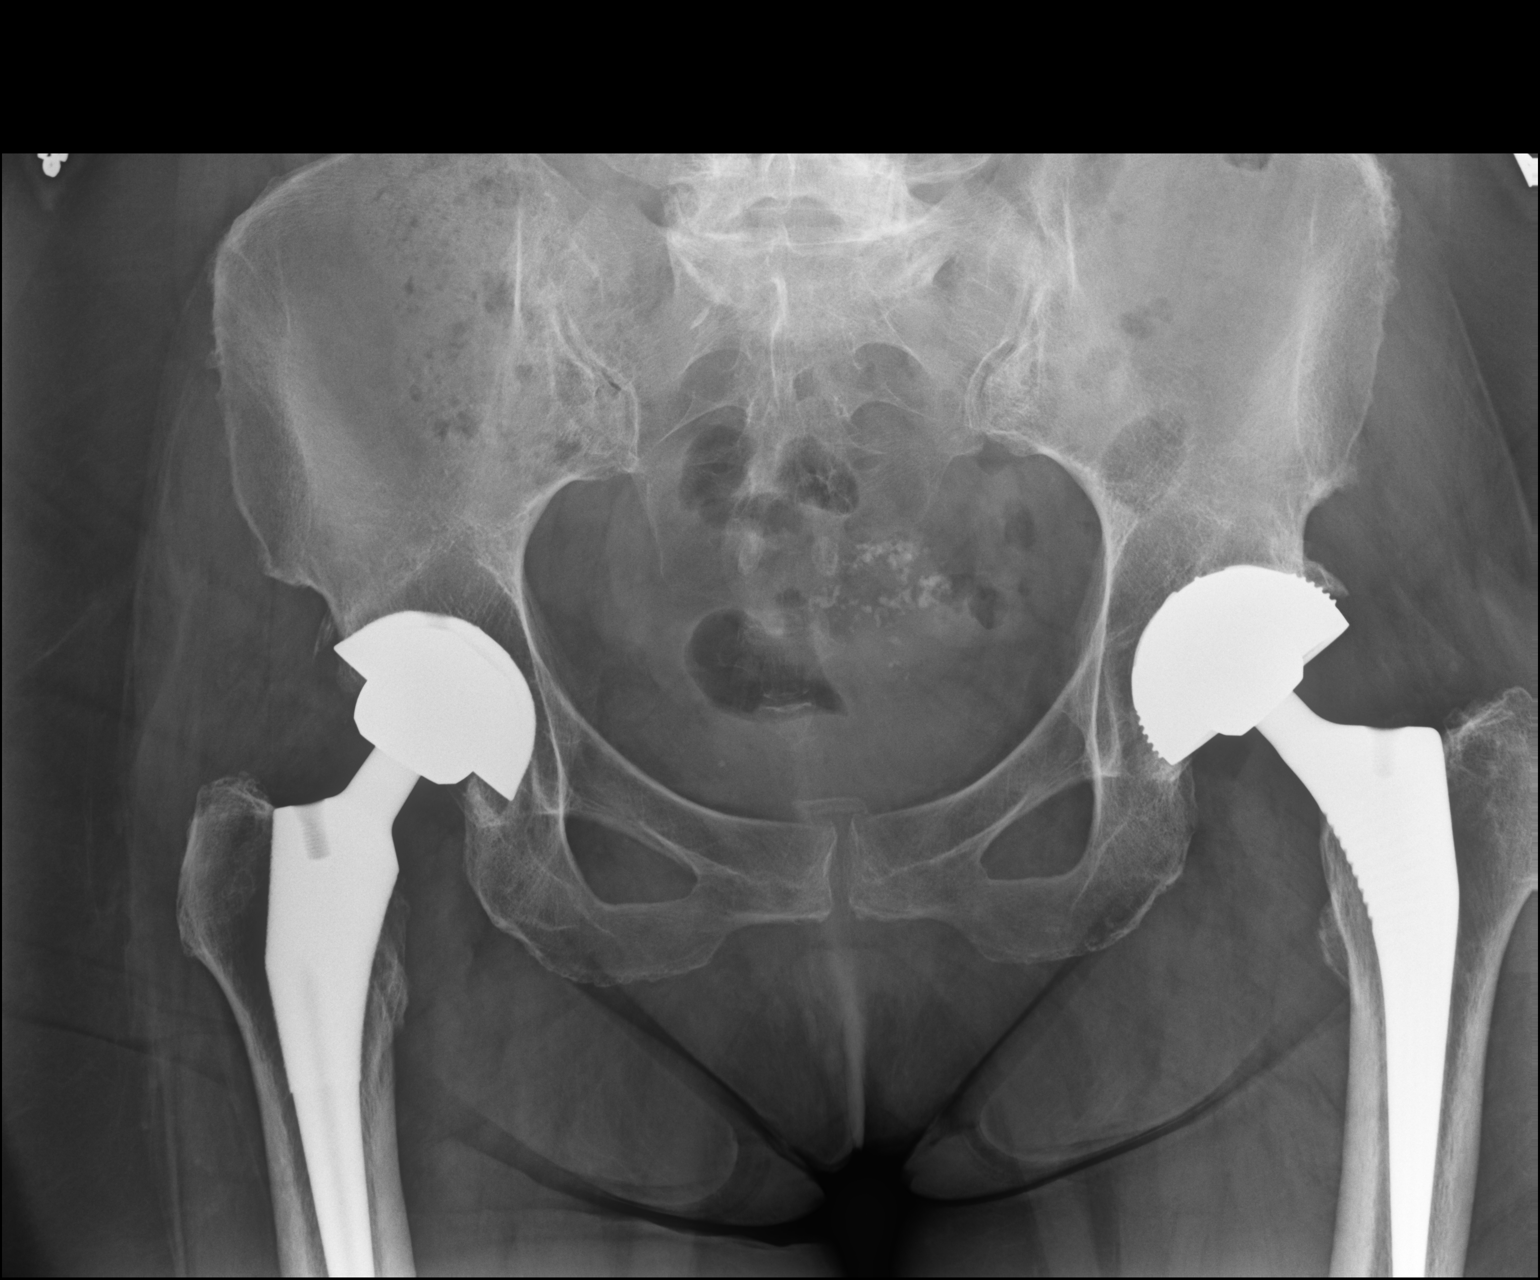

[dg hip unilat w or w/o pelvis 2-3 views  (2 of 3)]
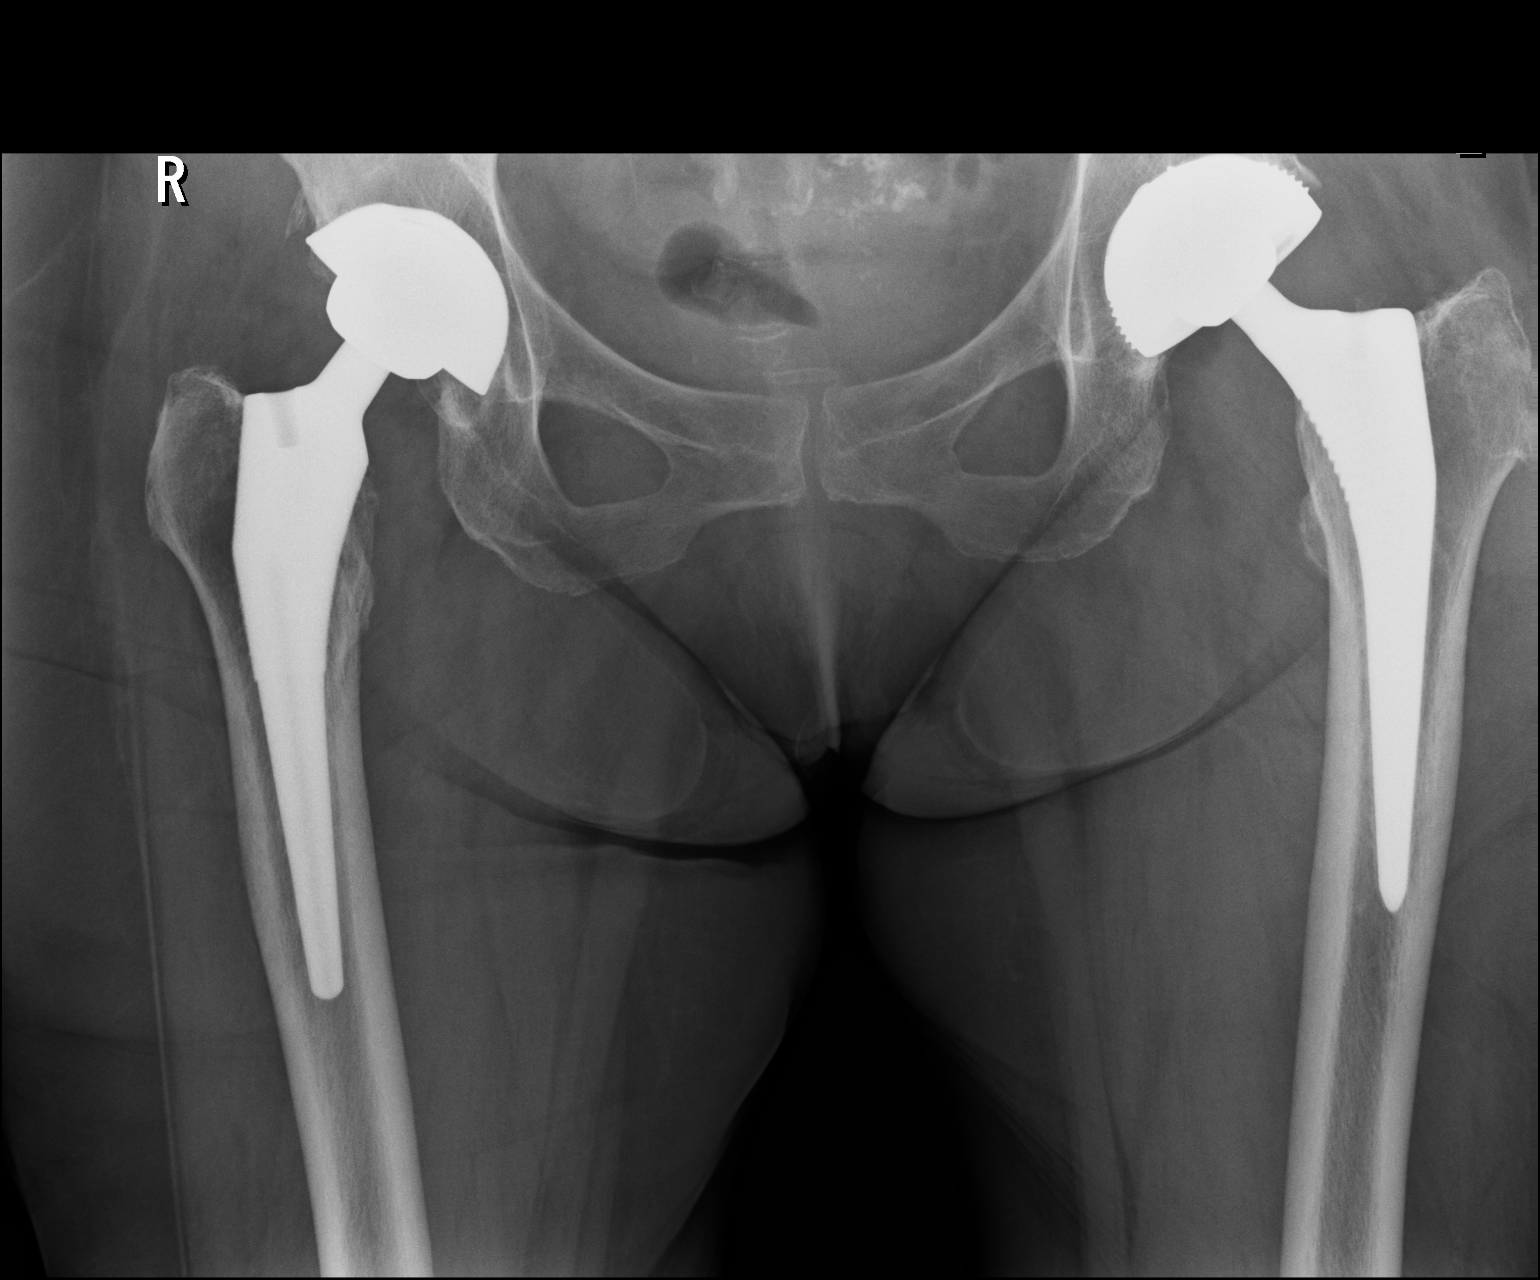

[dg hip unilat w or w/o pelvis 2-3 views  (3 of 3)]
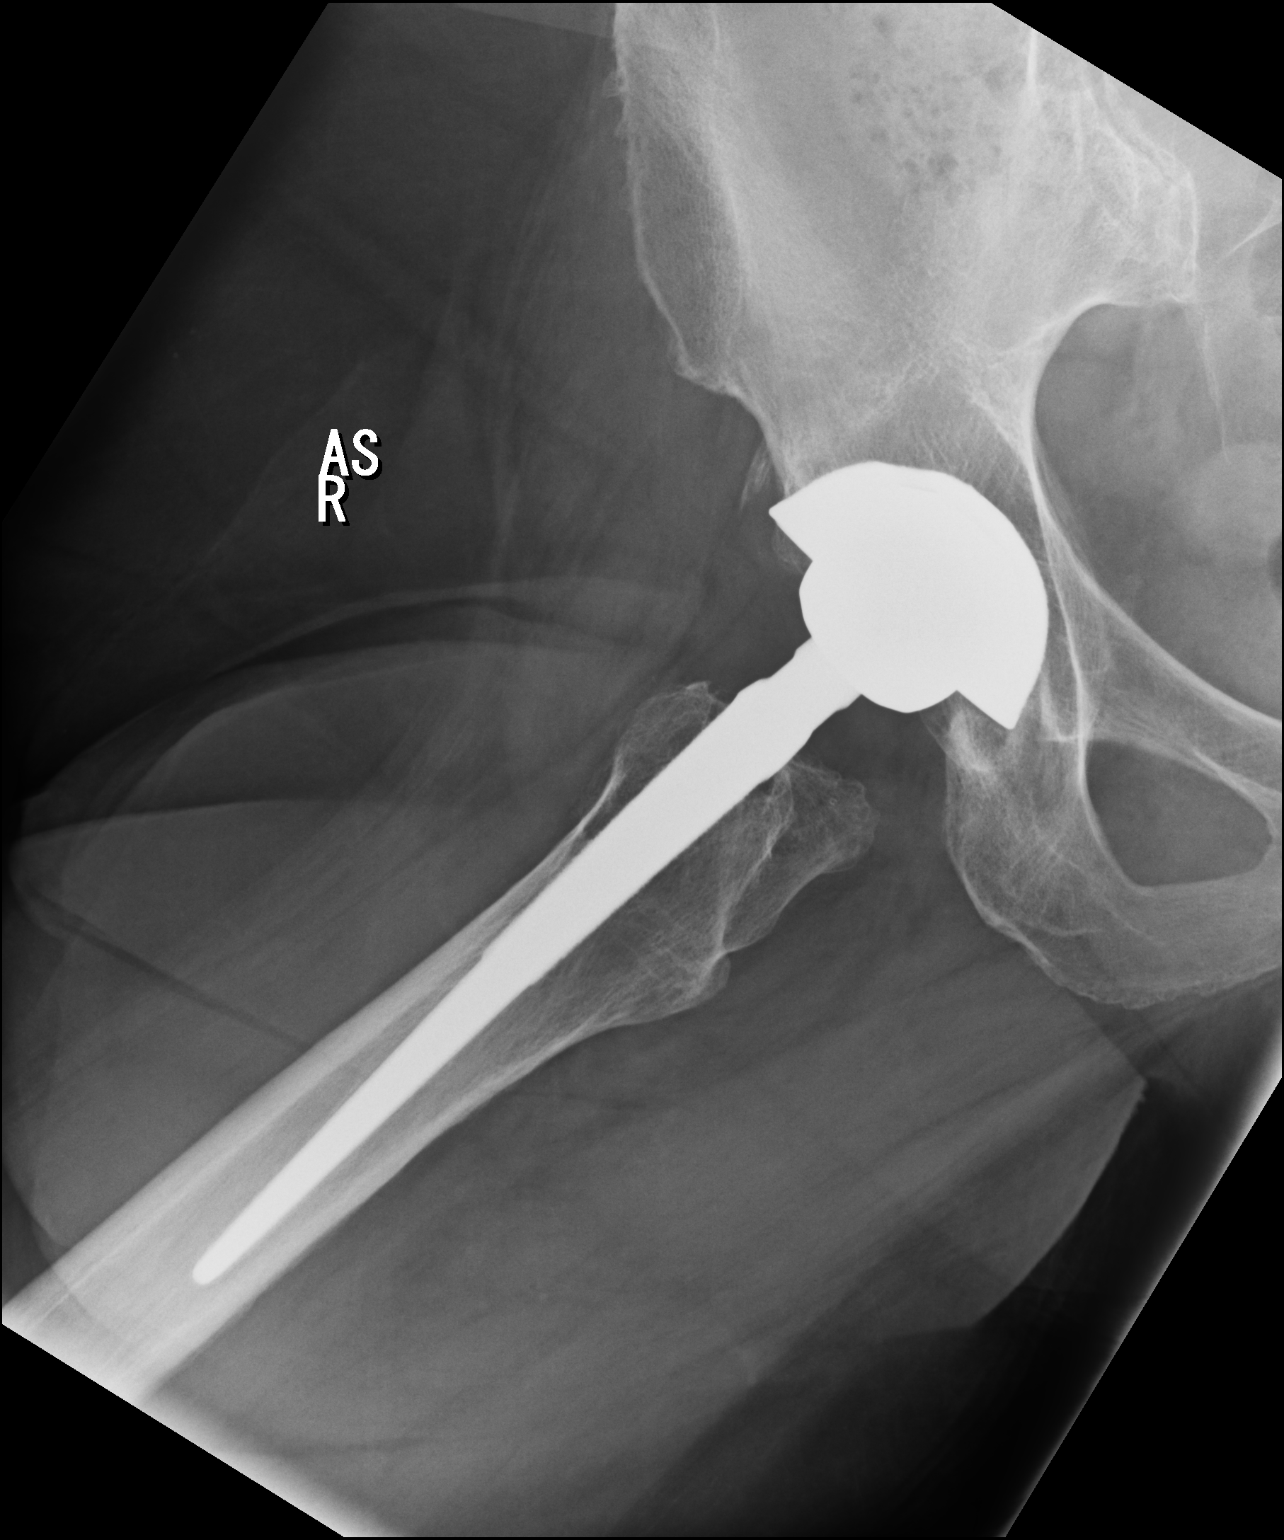

[3 of 3 positions shown; findings below may reference images not displayed]

FINDINGS: Bilateral total hip arthroplasty are in place. There is no breakage
or loosening of the hardware. No acute fracture. Osteopenia.
Calcifications in the pelvis suggest a calcified fibroid.
IMPRESSION: No acute bony pathology. Postoperative and chronic changes are
noted.

## 2020-01-12 ENCOUNTER — Ambulatory Visit: Payer: PRIVATE HEALTH INSURANCE | Admitting: Dermatology

## 2020-02-24 ENCOUNTER — Other Ambulatory Visit: Payer: Self-pay

## 2020-02-24 ENCOUNTER — Ambulatory Visit (INDEPENDENT_AMBULATORY_CARE_PROVIDER_SITE_OTHER): Payer: Self-pay | Admitting: Dermatology

## 2020-02-24 DIAGNOSIS — L988 Other specified disorders of the skin and subcutaneous tissue: Secondary | ICD-10-CM

## 2020-02-24 DIAGNOSIS — B009 Herpesviral infection, unspecified: Secondary | ICD-10-CM

## 2020-02-24 MED ORDER — VALACYCLOVIR HCL 1 G PO TABS: ORAL_TABLET | ORAL | 11 refills | Status: AC

## 2020-02-24 NOTE — Progress Notes (Signed)
   Follow-Up Visit   Subjective  Tamara Alvarado is a 69 y.o. female who presents for the following: Facial Elastosis.  Patient here today for filler and Botox.   The following portions of the chart were reviewed this encounter and updated as appropriate:  Tobacco  Allergies  Meds  Problems  Med Hx  Surg Hx  Fam Hx      Review of Systems:  No other skin or systemic complaints except as noted in HPI or Assessment and Plan.  Objective  Well appearing patient in no apparent distress; mood and affect are within normal limits.  A focused examination was performed including face. Relevant physical exam findings are noted in the Assessment and Plan.  Objective  Glabella, forehead: Rhytides and volume loss.   Images                         Assessment & Plan  Elastosis of skin Glabella, forehead  Botox 55.5 units injected today to glabella and forehead. Restylane Defyne injected to nasolabial folds. Restylane Refyne injected to anterior chin, oral commissures and philtrum.   Lot Number: [id number] (Restylane Refyne) [id number] (Restylane Defyne)  Expiration date: 11/13/2020 (Restylane Refyne), 07/16/2021 (Restylane Defyne)  Botox Injection - Glabella, forehead Location: See attached image  Informed consent: Discussed risks (infection, pain, bleeding, bruising, swelling, allergic reaction, paralysis of nearby muscles, eyelid droop, double vision, neck weakness, difficulty breathing, headache, undesirable cosmetic result, and need for additional treatment) and benefits of the procedure, as well as the alternatives.  Informed consent was obtained.  Preparation: The area was cleansed with alcohol.  Procedure Details:  Botox was injected into the dermis with a 30-gauge needle. Pressure applied to any bleeding. Ice packs offered for swelling.  Lot Number:  [id number] Expiration:  03/2022  Total Units Injected:  55.5  Plan: Patient was instructed to remain upright  for 4 hours. Patient was instructed to avoid massaging the face and avoid vigorous exercise for the rest of the day. Tylenol may be used for headache.  Allow 2 weeks before returning to clinic for additional dosing as needed. Patient will call for any problems. Prior to the procedure, the patient's past medical history, allergies and the rare but potential risks and complications were reviewed with the patient and a signed consent was obtained. Pre and post-treatment care was discussed and instructions provided.  Location: perioral, nasolabial folds, and chin  Filler Type: Restylane refyne and Restylane defyne  Procedure: The area was prepped thoroughly with hibiclens After introducing the needle into the desired treatment area, the syringe plunger was drawn back to ensure there was no flash of blood prior to injecting the filler in order to minimize risk of intravascular injection and vascular occlusion. After injection of the filler, the treated areas were cleansed and iced to reduce swelling. Post-treatment instructions were reviewed with the patient.       Patient tolerated the procedure well. The patient will call with any problems, questions or concerns prior to their next appointment.         Return if symptoms worsen or fail to improve.  Anise Salvo, RMA, am acting as scribe for Armida Sans, MD . Documentation: I have reviewed the above documentation for accuracy and completeness, and I agree with the above.  Armida Sans, MD

## 2020-02-28 ENCOUNTER — Encounter: Payer: Self-pay | Admitting: Dermatology
